# Patient Record
Sex: Male | Born: 1960 | Race: White | Hispanic: No | Marital: Single | Smoking: Never smoker
Health system: Southern US, Community
[De-identification: ages and names within clinical notes are randomized; demographics above are authoritative.]

## PROBLEM LIST (undated history)

## (undated) DIAGNOSIS — E7800 Pure hypercholesterolemia, unspecified: Secondary | ICD-10-CM

## (undated) DIAGNOSIS — H269 Unspecified cataract: Secondary | ICD-10-CM

## (undated) DIAGNOSIS — B029 Zoster without complications: Secondary | ICD-10-CM

## (undated) DIAGNOSIS — R569 Unspecified convulsions: Secondary | ICD-10-CM

## (undated) DIAGNOSIS — I10 Essential (primary) hypertension: Secondary | ICD-10-CM

## (undated) DIAGNOSIS — F99 Mental disorder, not otherwise specified: Secondary | ICD-10-CM

## (undated) HISTORY — DX: Essential (primary) hypertension: I10

## (undated) HISTORY — DX: Zoster without complications: B02.9

## (undated) HISTORY — DX: Pure hypercholesterolemia, unspecified: E78.00

## (undated) HISTORY — DX: Unspecified cataract: H26.9

---

## 2004-10-31 ENCOUNTER — Emergency Department (HOSPITAL_COMMUNITY): Admission: EM | Admit: 2004-10-31 | Discharge: 2004-10-31 | Payer: Self-pay | Admitting: Emergency Medicine

## 2004-11-01 ENCOUNTER — Emergency Department (HOSPITAL_COMMUNITY): Admission: EM | Admit: 2004-11-01 | Discharge: 2004-11-01 | Payer: Self-pay | Admitting: Emergency Medicine

## 2004-11-05 ENCOUNTER — Inpatient Hospital Stay (HOSPITAL_COMMUNITY): Admission: EM | Admit: 2004-11-05 | Discharge: 2004-11-09 | Payer: Self-pay | Admitting: Emergency Medicine

## 2009-09-22 DIAGNOSIS — K635 Polyp of colon: Secondary | ICD-10-CM

## 2009-09-22 HISTORY — DX: Polyp of colon: K63.5

## 2013-05-25 DIAGNOSIS — L219 Seborrheic dermatitis, unspecified: Secondary | ICD-10-CM

## 2013-05-25 HISTORY — DX: Seborrheic dermatitis, unspecified: L21.9

## 2016-10-05 ENCOUNTER — Encounter (HOSPITAL_COMMUNITY): Payer: Self-pay | Admitting: Emergency Medicine

## 2016-10-05 ENCOUNTER — Ambulatory Visit (HOSPITAL_COMMUNITY)
Admission: EM | Admit: 2016-10-05 | Discharge: 2016-10-05 | Disposition: A | Discharge status: Home / Self Care | Payer: Medicare Other | Attending: Internal Medicine | Admitting: Internal Medicine

## 2016-10-05 DIAGNOSIS — J209 Acute bronchitis, unspecified: Secondary | ICD-10-CM

## 2016-10-05 DIAGNOSIS — R0981 Nasal congestion: Secondary | ICD-10-CM | POA: Diagnosis not present

## 2016-10-05 DIAGNOSIS — R05 Cough: Secondary | ICD-10-CM

## 2016-10-05 HISTORY — DX: Unspecified convulsions: R56.9

## 2016-10-05 MED ORDER — IPRATROPIUM-ALBUTEROL 0.5-2.5 (3) MG/3ML IN SOLN
3.0000 mL | Freq: Once | RESPIRATORY_TRACT | Status: AC
  Administered 2016-10-05: 3 mL via RESPIRATORY_TRACT

## 2016-10-05 MED ORDER — ONDANSETRON HCL 4 MG PO TABS: 8.0000 mg | ORAL_TABLET | ORAL | 0 refills | Status: DC | PRN

## 2016-10-05 MED ORDER — PREDNISONE 50 MG PO TABS: 50.0000 mg | ORAL_TABLET | Freq: Every day | ORAL | 0 refills | Status: DC

## 2016-10-05 MED ORDER — IPRATROPIUM-ALBUTEROL 0.5-2.5 (3) MG/3ML IN SOLN
RESPIRATORY_TRACT | Status: AC
  Filled 2016-10-05: qty 3

## 2016-10-05 MED ORDER — ALBUTEROL SULFATE HFA 108 (90 BASE) MCG/ACT IN AERS: 2.0000 | INHALATION_SPRAY | RESPIRATORY_TRACT | 0 refills | Status: DC | PRN

## 2016-10-05 MED ORDER — TRIAMCINOLONE ACETONIDE 55 MCG/ACT NA AERO: 2.0000 | INHALATION_SPRAY | Freq: Every day | NASAL | 0 refills | Status: DC

## 2016-10-05 NOTE — ED Triage Notes (Signed)
Here w/group home provider ([facility]ath[facility] Col[facility])  Pt c/o cold sx onset 1 week associated w/dry cough, dizziness, nauseas, abd pain  Denies fevers  Had Pepto bismol w/no relief.   A&O x4... NAD... Ambulatory

## 2016-10-05 NOTE — Discharge Instructions (Addendum)
Sinus congestion on exam can contribute to dizziness.  Push fluids, rest.  Anticipate gradual improvement in well being over the next several days.  Cough/congestion may take a couple weeks to subside.  Recheck for new fever >100.5, increasing phlegm production/nasal discharge, or if not starting to improve in a few days.  Prescriptions for prednisone (for congestion), nasal steroid spray (also for congestion), and an albuterol inhaler (for cough) were sent to the pharmacy.  Also sent a prescription for ondansetron for nausea/vomiting, to use if needed.

## 2016-10-05 NOTE — ED Provider Notes (Signed)
[facility]    CSN: [id number] Arrival date & time: 10/05/16  1209     History   Chief Complaint Chief Complaint  Patient presents with  . URI    HPI Gary Neal is a 56 y.o. male. Presents today with 1wk history coughing spells, hard coughing.  No fever.  Unexpected emesis during dinner last evening, unclear if post-tussive.  No further emesis.  Ate uneventfully this am.  Denies abd pain, denies diarrhea.  Also feels a little dizzy.  Has not fallen.  Here with staff member from group home where he resides.  Walked in independently.      HPI  Past Medical History:  Diagnosis Date  . Seizures (HCC)     History reviewed. No pertinent surgical history.     Home Medications    Prior to Admission medications   Medication Sig Start Date End Date Taking? Authorizing Provider  allopurinol (ZYLOPRIM) 100 MG tablet Take 100 mg by mouth daily.   Yes [provider]  cetirizine (ZYRTEC) 10 MG tablet Take 10 mg by mouth daily.   Yes [provider]  clotrimazole-betamethasone (LOTRISONE) cream Apply 1 application topically 2 (two) times daily.   Yes [provider]  colchicine 0.6 MG tablet Take 0.6 mg by mouth daily.   Yes [provider]  Docusate Sodium (STOOL SOFTENER) 100 MG capsule Take 100 mg by mouth 2 (two) times daily.   Yes [provider]  Ergocalciferol (VITAMIN D2 PO) Take by mouth.   Yes [provider]  lisinopril (PRINIVIL,ZESTRIL) 10 MG tablet Take 10 mg by mouth daily.   Yes [provider]  metoprolol succinate (TOPROL-XL) 25 MG 24 hr tablet Take 25 mg by mouth daily.   Yes [provider]  nystatin cream (MYCOSTATIN) Apply 1 application topically 2 (two) times daily.   Yes [provider]  primidone (MYSOLINE) 250 MG tablet Take 250 mg by mouth 4 (four) times daily.   Yes [provider]  simvastatin (ZOCOR) 20 MG tablet Take 20 mg by mouth daily.   Yes  [provider]  triamterene-hydrochlorothiazide (DYAZIDE) 37.5-25 MG capsule Take 1 capsule by mouth daily.   Yes [provider]  albuterol (PROVENTIL HFA;VENTOLIN HFA) 108 (90 Base) MCG/ACT inhaler Inhale 2 puffs into the lungs every 4 (four) hours as needed for wheezing or shortness of breath. 10/05/16   Eustace [name], MD  ondansetron (ZOFRAN) 4 MG tablet Take 2 tablets (8 mg total) by mouth every 4 (four) hours as needed for nausea or vomiting. 10/05/16   Eustace [name], MD  predniSONE (DELTASONE) 50 MG tablet Take 1 tablet (50 mg total) by mouth daily. 10/05/16   Eustace [name], MD  triamcinolone (NASACORT) 55 MCG/ACT AERO nasal inhaler Place 2 sprays into the nose daily. 10/05/16   Eustace [name], MD    Family History History reviewed. No pertinent family history.  Social History Social History  Substance Use Topics  . Smoking status: Never Smoker  . Smokeless tobacco: Never Used  . Alcohol use Not on file     Allergies   Patient has no known allergies.   Review of Systems Review of Systems  All other systems reviewed and are negative.    Physical Exam Triage Vital Signs ED Triage Vitals  Enc Vitals Group     BP 10/05/16 1322 (!) 166/74     Pulse Rate 10/05/16 1322 99     Resp 10/05/16 1322 20  Temp 10/05/16 1322 98.1 F (36.7 C)     Temp Source 10/05/16 1322 Oral     SpO2 10/05/16 1322 96 %     Weight --      Height --      Pain Score 10/05/16 1323 10     Pain Loc --    Updated Vital Signs BP (!) 166/74 (BP Location: Right Arm)   Pulse 88   Temp 98.1 F (36.7 C) (Oral)   Resp 20   SpO2 98%   Physical Exam  Constitutional: He is oriented to person, place, and time. No distress.  Alert, nicely groomed  HENT:  Head: Atraumatic.  bilat TMs obscured by wax; no ear complaints Mod severe nasal congestion bilat Throat slightly injected with post nasal drainage evident  Eyes:  Conjugate gaze, no eye redness/drainage  Neck: Neck  supple.  Cardiovascular: Normal rate and regular rhythm.   Pulmonary/Chest: No respiratory distress. He has no wheezes. He has no rales.  Coarse but symmetric breath sounds throughout  Abdominal: He exhibits no distension.  Musculoskeletal: Normal range of motion.  Neurological: He is alert and oriented to person, place, and time.  Walked into urgent care independently, climbed on/off exam table  Skin: Skin is warm and dry.  No cyanosis  Nursing note and vitals reviewed.    UC Treatments / Results   Procedures Procedures (including critical care time)  Medications Ordered in UC Medications  ipratropium-albuterol (DUONEB) 0.5-2.5 (3) MG/3ML nebulizer solution 3 mL (3 mLs Nebulization Given 10/05/16 1345)   Reports that breathing treatment was helpful.   Final Clinical Impressions(s) / UC Diagnoses   Final diagnoses:  Acute bronchitis, unspecified organism  Sinus congestion   Sinus congestion on exam can contribute to dizziness.  Push fluids, rest.  Anticipate gradual improvement in well being over the next several days.  Cough/congestion may take a couple weeks to subside.  Recheck for new fever >100.5, increasing phlegm production/nasal discharge, or if not starting to improve in a few days.  Prescriptions for prednisone (for congestion), nasal steroid spray (also for congestion), and an albuterol inhaler (for cough) were sent to the pharmacy.  Also sent a prescription for ondansetron for nausea/vomiting, to use if needed.  New Prescriptions Discharge Medication List as of 10/05/2016  2:26 PM    START taking these medications   Details  albuterol (PROVENTIL HFA;VENTOLIN HFA) 108 (90 Base) MCG/ACT inhaler Inhale 2 puffs into the lungs every 4 (four) hours as needed for wheezing or shortness of breath., Starting Sat 10/05/2016, Normal    ondansetron (ZOFRAN) 4 MG tablet Take 2 tablets (8 mg total) by mouth every 4 (four) hours as needed for nausea or vomiting., Starting Sat  10/05/2016, Normal    predniSONE (DELTASONE) 50 MG tablet Take 1 tablet (50 mg total) by mouth daily., Starting Sat 10/05/2016, Normal    triamcinolone (NASACORT) 55 MCG/ACT AERO nasal inhaler Place 2 sprays into the nose daily., Starting Sat 10/05/2016, Normal         Eustace MooreMurray, Vinnie Gombert W, MD 10/05/16 2114

## 2018-10-19 ENCOUNTER — Inpatient Hospital Stay (HOSPITAL_COMMUNITY): Payer: Medicare HMO

## 2018-10-19 ENCOUNTER — Inpatient Hospital Stay (HOSPITAL_COMMUNITY)
Admission: EM | Admit: 2018-10-19 | Discharge: 2018-10-21 | DRG: 854 | Disposition: A | Discharge status: Home / Self Care | Payer: Medicare HMO | Attending: Internal Medicine | Admitting: Internal Medicine

## 2018-10-19 ENCOUNTER — Other Ambulatory Visit: Payer: Self-pay

## 2018-10-19 ENCOUNTER — Emergency Department (HOSPITAL_COMMUNITY): Payer: Medicare HMO

## 2018-10-19 ENCOUNTER — Encounter (HOSPITAL_COMMUNITY): Payer: Self-pay | Admitting: Emergency Medicine

## 2018-10-19 DIAGNOSIS — G40909 Epilepsy, unspecified, not intractable, without status epilepticus: Secondary | ICD-10-CM | POA: Diagnosis present

## 2018-10-19 DIAGNOSIS — E871 Hypo-osmolality and hyponatremia: Secondary | ICD-10-CM | POA: Diagnosis present

## 2018-10-19 DIAGNOSIS — R1011 Right upper quadrant pain: Secondary | ICD-10-CM | POA: Diagnosis present

## 2018-10-19 DIAGNOSIS — Z79899 Other long term (current) drug therapy: Secondary | ICD-10-CM

## 2018-10-19 DIAGNOSIS — J90 Pleural effusion, not elsewhere classified: Secondary | ICD-10-CM | POA: Diagnosis present

## 2018-10-19 DIAGNOSIS — I10 Essential (primary) hypertension: Secondary | ICD-10-CM | POA: Diagnosis present

## 2018-10-19 DIAGNOSIS — R6250 Unspecified lack of expected normal physiological development in childhood: Secondary | ICD-10-CM | POA: Diagnosis present

## 2018-10-19 DIAGNOSIS — E785 Hyperlipidemia, unspecified: Secondary | ICD-10-CM | POA: Diagnosis present

## 2018-10-19 DIAGNOSIS — M109 Gout, unspecified: Secondary | ICD-10-CM | POA: Diagnosis present

## 2018-10-19 DIAGNOSIS — K82A1 Gangrene of gallbladder in cholecystitis: Secondary | ICD-10-CM | POA: Diagnosis present

## 2018-10-19 DIAGNOSIS — R0989 Other specified symptoms and signs involving the circulatory and respiratory systems: Secondary | ICD-10-CM

## 2018-10-19 DIAGNOSIS — I4519 Other right bundle-branch block: Secondary | ICD-10-CM | POA: Diagnosis present

## 2018-10-19 DIAGNOSIS — R0682 Tachypnea, not elsewhere classified: Secondary | ICD-10-CM

## 2018-10-19 DIAGNOSIS — A419 Sepsis, unspecified organism: Principal | ICD-10-CM | POA: Diagnosis present

## 2018-10-19 DIAGNOSIS — F79 Unspecified intellectual disabilities: Secondary | ICD-10-CM

## 2018-10-19 DIAGNOSIS — K819 Cholecystitis, unspecified: Secondary | ICD-10-CM

## 2018-10-19 DIAGNOSIS — E876 Hypokalemia: Secondary | ICD-10-CM | POA: Diagnosis present

## 2018-10-19 DIAGNOSIS — E669 Obesity, unspecified: Secondary | ICD-10-CM | POA: Diagnosis present

## 2018-10-19 DIAGNOSIS — E860 Dehydration: Secondary | ICD-10-CM | POA: Diagnosis present

## 2018-10-19 DIAGNOSIS — N179 Acute kidney failure, unspecified: Secondary | ICD-10-CM | POA: Diagnosis present

## 2018-10-19 DIAGNOSIS — K810 Acute cholecystitis: Secondary | ICD-10-CM | POA: Diagnosis present

## 2018-10-19 DIAGNOSIS — Z8619 Personal history of other infectious and parasitic diseases: Secondary | ICD-10-CM | POA: Diagnosis not present

## 2018-10-19 DIAGNOSIS — Z20828 Contact with and (suspected) exposure to other viral communicable diseases: Secondary | ICD-10-CM | POA: Diagnosis present

## 2018-10-19 HISTORY — DX: Acute cholecystitis: K81.0

## 2018-10-19 LAB — URINALYSIS, ROUTINE W REFLEX MICROSCOPIC
Bilirubin Urine: NEGATIVE
Glucose, UA: NEGATIVE mg/dL
Ketones, ur: NEGATIVE mg/dL
Leukocytes,Ua: NEGATIVE
Nitrite: NEGATIVE
Protein, ur: NEGATIVE mg/dL
Specific Gravity, Urine: 1.008 (ref 1.005–1.030)
pH: 5 (ref 5.0–8.0)

## 2018-10-19 LAB — LACTIC ACID, PLASMA
Lactic Acid, Venous: 1.7 mmol/L (ref 0.5–1.9)
Lactic Acid, Venous: 1.8 mmol/L (ref 0.5–1.9)

## 2018-10-19 LAB — BASIC METABOLIC PANEL
Anion gap: 15 (ref 5–15)
Anion gap: 12 (ref 5–15)
BUN: 29 mg/dL — ABNORMAL HIGH (ref 6–20)
BUN: 29 mg/dL — ABNORMAL HIGH (ref 6–20)
CO2: 20 mmol/L — ABNORMAL LOW (ref 22–32)
CO2: 19 mmol/L — ABNORMAL LOW (ref 22–32)
Calcium: 7.7 mg/dL — ABNORMAL LOW (ref 8.9–10.3)
Calcium: 7.5 mg/dL — ABNORMAL LOW (ref 8.9–10.3)
Chloride: 93 mmol/L — ABNORMAL LOW (ref 98–111)
Chloride: 95 mmol/L — ABNORMAL LOW (ref 98–111)
Creatinine, Ser: 1.39 mg/dL — ABNORMAL HIGH (ref 0.61–1.24)
Creatinine, Ser: 1.28 mg/dL — ABNORMAL HIGH (ref 0.61–1.24)
GFR calc Af Amer: >60 mL/min (ref >60)
GFR calc Af Amer: >60 mL/min (ref >60)
GFR calc non Af Amer: 55 mL/min — ABNORMAL LOW (ref >60)
GFR calc non Af Amer: >60 mL/min (ref >60)
Glucose, Bld: 105 mg/dL — ABNORMAL HIGH (ref 70–99)
Glucose, Bld: 118 mg/dL — ABNORMAL HIGH (ref 70–99)
Potassium: 3.5 mmol/L (ref 3.5–5.1)
Potassium: 3.5 mmol/L (ref 3.5–5.1)
Sodium: 128 mmol/L — ABNORMAL LOW (ref 135–145)
Sodium: 126 mmol/L — ABNORMAL LOW (ref 135–145)

## 2018-10-19 LAB — CBC WITH DIFFERENTIAL/PLATELET
Abs Immature Granulocytes: 0.21 10*3/uL — ABNORMAL HIGH (ref 0.00–0.07)
Basophils Absolute: 0 10*3/uL (ref 0.0–0.1)
Basophils Relative: 0 %
Eosinophils Absolute: 0.1 10*3/uL (ref 0.0–0.5)
Eosinophils Relative: 0 %
HCT: 44.9 % (ref 39.0–52.0)
Hemoglobin: 15.3 g/dL (ref 13.0–17.0)
Immature Granulocytes: 1 %
Lymphocytes Relative: 5 %
Lymphs Abs: 1.1 10*3/uL (ref 0.7–4.0)
MCH: 32.1 pg (ref 26.0–34.0)
MCHC: 34.1 g/dL (ref 30.0–36.0)
MCV: 94.1 fL (ref 80.0–100.0)
Monocytes Absolute: 2.5 10*3/uL — ABNORMAL HIGH (ref 0.1–1.0)
Monocytes Relative: 11 %
Neutro Abs: 18.1 10*3/uL — ABNORMAL HIGH (ref 1.7–7.7)
Neutrophils Relative %: 83 %
Platelets: 179 10*3/uL (ref 150–400)
RBC: 4.77 MIL/uL (ref 4.22–5.81)
RDW: 13.6 % (ref 11.5–15.5)
WBC: 22 10*3/uL — ABNORMAL HIGH (ref 4.0–10.5)
nRBC: 0 % (ref 0.0–0.2)

## 2018-10-19 LAB — COMPREHENSIVE METABOLIC PANEL
ALT: 44 U/L (ref 0–44)
AST: 68 U/L — ABNORMAL HIGH (ref 15–41)
Albumin: 3.6 g/dL (ref 3.5–5.0)
Alkaline Phosphatase: 74 U/L (ref 38–126)
Anion gap: 15 (ref 5–15)
BUN: 39 mg/dL — ABNORMAL HIGH (ref 6–20)
CO2: 22 mmol/L (ref 22–32)
Calcium: 7.5 mg/dL — ABNORMAL LOW (ref 8.9–10.3)
Chloride: 85 mmol/L — ABNORMAL LOW (ref 98–111)
Creatinine, Ser: 1.69 mg/dL — ABNORMAL HIGH (ref 0.61–1.24)
GFR calc Af Amer: 50 mL/min — ABNORMAL LOW (ref >60)
GFR calc non Af Amer: 43 mL/min — ABNORMAL LOW (ref >60)
Glucose, Bld: 119 mg/dL — ABNORMAL HIGH (ref 70–99)
Potassium: 2.9 mmol/L — ABNORMAL LOW (ref 3.5–5.1)
Sodium: 122 mmol/L — ABNORMAL LOW (ref 135–145)
Total Bilirubin: 0.9 mg/dL (ref 0.3–1.2)
Total Protein: 7.4 g/dL (ref 6.5–8.1)

## 2018-10-19 LAB — MRSA PCR SCREENING: MRSA by PCR: NEGATIVE

## 2018-10-19 LAB — SURGICAL PCR SCREEN
MRSA, PCR: NEGATIVE
Staphylococcus aureus: NEGATIVE

## 2018-10-19 LAB — SARS CORONAVIRUS 2 BY RT PCR (HOSPITAL ORDER, PERFORMED IN ~~LOC~~ HOSPITAL LAB): SARS Coronavirus 2: NEGATIVE

## 2018-10-19 LAB — LIPASE, BLOOD: Lipase: 24 U/L (ref 11–51)

## 2018-10-19 LAB — MAGNESIUM: Magnesium: 2.1 mg/dL (ref 1.7–2.4)

## 2018-10-19 MED ORDER — MUPIROCIN 2 % EX OINT
1.0000 "application " | TOPICAL_OINTMENT | Freq: Two times a day (BID) | CUTANEOUS | Status: DC
  Administered 2018-10-19: 1 via NASAL
  Filled 2018-10-19: qty 22

## 2018-10-19 MED ORDER — DONEPEZIL HCL 5 MG PO TABS
5.0000 mg | ORAL_TABLET | Freq: Every day | ORAL | Status: DC
  Administered 2018-10-19 – 2018-10-20 (×2): 5 mg via ORAL
  Filled 2018-10-19 (×2): qty 1

## 2018-10-19 MED ORDER — ENOXAPARIN SODIUM 40 MG/0.4ML ~~LOC~~ SOLN
40.0000 mg | SUBCUTANEOUS | Status: DC
  Administered 2018-10-19 – 2018-10-20 (×2): 40 mg via SUBCUTANEOUS
  Filled 2018-10-19 (×2): qty 0.4

## 2018-10-19 MED ORDER — ONDANSETRON HCL 4 MG PO TABS: 4.0000 mg | ORAL_TABLET | Freq: Four times a day (QID) | ORAL | Status: DC | PRN

## 2018-10-19 MED ORDER — SIMVASTATIN 20 MG PO TABS
20.0000 mg | ORAL_TABLET | Freq: Every day | ORAL | Status: DC
  Administered 2018-10-19 – 2018-10-21 (×3): 20 mg via ORAL
  Filled 2018-10-19 (×3): qty 1

## 2018-10-19 MED ORDER — ACETAMINOPHEN 325 MG PO TABS
650.0000 mg | ORAL_TABLET | Freq: Four times a day (QID) | ORAL | Status: DC | PRN
  Administered 2018-10-19 – 2018-10-21 (×4): 650 mg via ORAL
  Filled 2018-10-19 (×4): qty 2

## 2018-10-19 MED ORDER — IOHEXOL 300 MG/ML  SOLN
100.0000 mL | Freq: Once | INTRAMUSCULAR | Status: AC | PRN
  Administered 2018-10-19: 100 mL via INTRAVENOUS

## 2018-10-19 MED ORDER — ONDANSETRON HCL 4 MG/2ML IJ SOLN: 4.0000 mg | Freq: Four times a day (QID) | INTRAMUSCULAR | Status: DC | PRN

## 2018-10-19 MED ORDER — HYDRALAZINE HCL 20 MG/ML IJ SOLN: 5.0000 mg | Freq: Four times a day (QID) | INTRAMUSCULAR | Status: DC | PRN

## 2018-10-19 MED ORDER — POTASSIUM CHLORIDE 10 MEQ/100ML IV SOLN
10.0000 meq | INTRAVENOUS | Status: AC
  Administered 2018-10-19 (×2): 10 meq via INTRAVENOUS
  Filled 2018-10-19 (×2): qty 100

## 2018-10-19 MED ORDER — POTASSIUM CHLORIDE IN NACL 40-0.9 MEQ/L-% IV SOLN
INTRAVENOUS | Status: DC
  Filled 2018-10-19: qty 1000

## 2018-10-19 MED ORDER — SODIUM CHLORIDE 0.9 % IV BOLUS
1000.0000 mL | Freq: Once | INTRAVENOUS | Status: AC
Start: 2018-10-19 — End: 2018-10-19
  Administered 2018-10-19: 1000 mL via INTRAVENOUS

## 2018-10-19 MED ORDER — PRIMIDONE 250 MG PO TABS
250.0000 mg | ORAL_TABLET | Freq: Three times a day (TID) | ORAL | Status: DC
  Administered 2018-10-19 – 2018-10-21 (×4): 250 mg via ORAL
  Filled 2018-10-19 (×7): qty 1

## 2018-10-19 MED ORDER — LORATADINE 10 MG PO TABS
10.0000 mg | ORAL_TABLET | Freq: Every day | ORAL | Status: DC
  Administered 2018-10-20 – 2018-10-21 (×2): 10 mg via ORAL
  Filled 2018-10-19 (×2): qty 1

## 2018-10-19 MED ORDER — METOPROLOL SUCCINATE ER 25 MG PO TB24
25.0000 mg | ORAL_TABLET | Freq: Two times a day (BID) | ORAL | Status: DC
  Administered 2018-10-19 – 2018-10-21 (×4): 25 mg via ORAL
  Filled 2018-10-19 (×4): qty 1

## 2018-10-19 MED ORDER — PIPERACILLIN-TAZOBACTAM 3.375 G IVPB
3.3750 g | Freq: Three times a day (TID) | INTRAVENOUS | Status: DC
  Administered 2018-10-19 – 2018-10-20 (×2): 3.375 g via INTRAVENOUS
  Filled 2018-10-19 (×2): qty 50

## 2018-10-19 MED ORDER — PIPERACILLIN-TAZOBACTAM 3.375 G IVPB 30 MIN
3.3750 g | Freq: Once | INTRAVENOUS | Status: AC
  Administered 2018-10-19: 3.375 g via INTRAVENOUS
  Filled 2018-10-19: qty 50

## 2018-10-19 MED ORDER — SODIUM CHLORIDE (PF) 0.9 % IJ SOLN
INTRAMUSCULAR | Status: AC
  Filled 2018-10-19: qty 50

## 2018-10-19 NOTE — Progress Notes (Signed)
Pharmacy Antibiotic Note  Gary Neal is a 58 y.o. male admitted on 10/19/2018 with intra-abdominal infection.  Pharmacy has been consulted for Zosyn dosing.  Scr elevated (baseline unknown).  CrCl >42ml/min.   Plan: Zosyn 3.375g IV q8h (4 hour infusion).  No dose adjustments anticipated.  Pharmacy will sign off & monitor peripherally via electronic surveillance software.  Please re-consult if needed.    Height: 5\' 11"  (180.3 cm) Weight: 200 lb (90.7 kg) IBW/kg (Calculated) : 75.3  Temp (24hrs), Avg:98.4 F (36.9 C), Min:98.4 F (36.9 C), Max:98.4 F (36.9 C)  Recent Labs  Lab 10/19/18 0928  WBC 22.0*  CREATININE 1.69*  LATICACIDVEN 1.7    Estimated Creatinine Clearance: 54.3 mL/min (A) (by C-G formula based on SCr of 1.69 mg/dL (H)).    No Known Allergies  Antimicrobials this admission: 10/19/18  Zosyn >>   Dose adjustments this admission:  Microbiology results:  Thank you for allowing pharmacy to be a part of this patient's care.  Elson Clan 10/19/2018 2:03 PM

## 2018-10-19 NOTE — Plan of Care (Signed)
  Problem: Clinical Measurements: Goal: Ability to maintain clinical measurements within normal limits will improve Outcome: Progressing Goal: Will remain free from infection Outcome: Progressing Goal: Diagnostic test results will improve Outcome: Progressing Goal: Respiratory complications will improve Outcome: Progressing Goal: Cardiovascular complication will be avoided Outcome: Progressing   Problem: Nutrition: Goal: Adequate nutrition will be maintained Outcome: Progressing   Problem: Coping: Goal: Level of anxiety will decrease Outcome: Progressing   Problem: Pain Managment: Goal: General experience of comfort will improve Outcome: Progressing   Problem: Skin Integrity: Goal: Risk for impaired skin integrity will decrease Outcome: Progressing   

## 2018-10-19 NOTE — ED Notes (Signed)
Clint Guy requesting to call [phone] regarding plan of care decisions.

## 2018-10-19 NOTE — ED Triage Notes (Signed)
Patient arrived by EMS from a group home. Pt c/o RT upper ABD pain and headache per EMS. Staff at group home stated patient became bowel incontinent per EMS. Staff stated patient has had diarrhea x 3 days. Pt c/o ABD pain. Staff stated patient has decreased mental status per EMS.    Pt has developmental delays. Pt alert and oriented x 2.   Patient normally is more talkative and able to communicate per EMS.   EMS VS BP 132/77, HR 96, CBG 132, T 99.7, SpO2 96%.

## 2018-10-19 NOTE — ED Notes (Signed)
ED TO INPATIENT HANDOFF REPORT  ED Nurse Name and Phone #: Joice Lofts, RN [phone]  S Name/Age/Gender Gary Neal 58 y.o. male Room/Bed: WA17/WA17  Code Status   Code Status: Not on file  Home/SNF/Other [facility]  Patient oriented to: self, sittuation Is this baseline? Yes   Triage Complete: Triage complete  Chief Complaint Abd pain; diarrhea  Triage Note Patient arrived by EMS from a group home. Pt c/o RT upper ABD pain and headache per EMS. Staff at group home stated patient became bowel incontinent per EMS. Staff stated patient has had diarrhea x 3 days. Pt c/o ABD pain. Staff stated patient has decreased mental status per EMS.    Pt has developmental delays. Pt alert and oriented x 2.   Patient normally is more talkative and able to communicate per EMS.   EMS VS BP 132/77, HR 96, CBG 132, T 99.7, SpO2 96%.   Allergies No Known Allergies  Level of Care/Admitting Diagnosis ED Disposition    ED Disposition Condition Comment   Admit  Hospital Area: [facility] [[id number]]  Level of Care: Telemetry [5]  Admit to tele based on following criteria: Other see comments  Comments: Electrolyte abnormalities  Covid Evaluation: Screening Protocol (No Symptoms)  Diagnosis: Acute cholecystitis [575.0.ICD-9-CM]  Admitting Physician: Narda Bonds [[id number]]  Attending Physician: Narda Bonds [phone]  Estimated length of stay: past midnight tomorrow  Certification:: I certify this patient will need inpatient services for at least 2 midnights  PT Class (Do Not Modify): Inpatient [101]  PT Acc Code (Do Not Modify): Private [1]       B Medical/Surgery History Past Medical History:  Diagnosis Date  . Seizures (HCC)    History reviewed. No pertinent surgical history.   A IV Location/Drains/Wounds Patient Lines/Drains/Airways Status   Active Line/Drains/Airways    Name:   Placement date:   Placement time:   Site:   Days:   Peripheral IV 10/19/18  Right Antecubital   10/19/18    0934    Antecubital   less than 1   Peripheral IV 10/19/18 Right Forearm   10/19/18    1540    Forearm   less than 1   Urethral Catheter Alyson Reedy, NT Straight-tip 16 Fr.   10/19/18    1603    Straight-tip   less than 1          Intake/Output Last 24 hours  Intake/Output Summary (Last 24 hours) at 10/19/2018 1640 Last data filed at 10/19/2018 1627 Gross per 24 hour  Intake 1147.54 ml  Output 325 ml  Net 822.54 ml    Labs/Imaging Results for orders placed or performed during the hospital encounter of 10/19/18 (from the past 48 hour(s))  Urinalysis, Routine w reflex microscopic     Status: Abnormal   Collection Time: 10/19/18  8:53 AM  Result Value Ref Range   Color, Urine YELLOW YELLOW   APPearance CLEAR CLEAR   Specific Gravity, Urine 1.008 1.005 - 1.030   pH 5.0 5.0 - 8.0   Glucose, UA NEGATIVE NEGATIVE mg/dL   Hgb urine dipstick SMALL (A) NEGATIVE   Bilirubin Urine NEGATIVE NEGATIVE   Ketones, ur NEGATIVE NEGATIVE mg/dL   Protein, ur NEGATIVE NEGATIVE mg/dL   Nitrite NEGATIVE NEGATIVE   Leukocytes,Ua NEGATIVE NEGATIVE   WBC, UA 0-5 0 - 5 WBC/hpf   Bacteria, UA RARE (A) NONE SEEN   Squamous Epithelial / LPF 0-5 0 - 5   Mucus PRESENT  Comment: Performed at [facility], [address] [place], [place] [address]  Comprehensive metabolic panel     Status: Abnormal   Collection Time: 10/19/18  9:28 AM  Result Value Ref Range   Sodium 122 (L) 135 - 145 mmol/L   Potassium 2.9 (L) 3.5 - 5.1 mmol/L   Chloride 85 (L) 98 - 111 mmol/L   CO2 22 22 - 32 mmol/L   Glucose, Bld 119 (H) 70 - 99 mg/dL   BUN 39 (H) 6 - 20 mg/dL   Creatinine, Ser 6.04 (H) 0.61 - 1.24 mg/dL   Calcium 7.5 (L) 8.9 - 10.3 mg/dL   Total Protein 7.4 6.5 - 8.1 g/dL   Albumin 3.6 3.5 - 5.0 g/dL   AST 68 (H) 15 - 41 U/L   ALT 44 0 - 44 U/L   Alkaline Phosphatase 74 38 - 126 U/L   Total Bilirubin 0.9 0.3 - 1.2 mg/dL   GFR calc non Af Amer 43 (L)  >60 mL/min   GFR calc Af Amer 50 (L) >60 mL/min   Anion gap 15 5 - 15    Comment: Performed at [facility], [address], [place], [place] [address]  CBC with Differential     Status: Abnormal   Collection Time: 10/19/18  9:28 AM  Result Value Ref Range   WBC 22.0 (H) 4.0 - 10.5 K/uL   RBC 4.77 4.22 - 5.81 MIL/uL   Hemoglobin 15.3 13.0 - 17.0 g/dL   HCT 11.9 14.7 - 82.9 %   MCV 94.1 80.0 - 100.0 fL   MCH 32.1 26.0 - 34.0 pg   MCHC 34.1 30.0 - 36.0 g/dL   RDW 56.2 13.0 - 86.5 %   Platelets 179 150 - 400 K/uL   nRBC 0.0 0.0 - 0.2 %   Neutrophils Relative % 83 %   Neutro Abs 18.1 (H) 1.7 - 7.7 K/uL   Lymphocytes Relative 5 %   Lymphs Abs 1.1 0.7 - 4.0 K/uL   Monocytes Relative 11 %   Monocytes Absolute 2.5 (H) 0.1 - 1.0 K/uL   Eosinophils Relative 0 %   Eosinophils Absolute 0.1 0.0 - 0.5 K/uL   Basophils Relative 0 %   Basophils Absolute 0.0 0.0 - 0.1 K/uL   Immature Granulocytes 1 %   Abs Immature Granulocytes 0.21 (H) 0.00 - 0.07 K/uL    Comment: Performed at [facility], [address], [place], [place] [address]  Lactic acid, plasma     Status: None   Collection Time: 10/19/18  9:28 AM  Result Value Ref Range   Lactic Acid, Venous 1.7 0.5 - 1.9 mmol/L    Comment: Performed at [facility], [address], [place], [place] [address]  Lipase, blood     Status: None   Collection Time: 10/19/18  9:28 AM  Result Value Ref Range   Lipase 24 11 - 51 U/L    Comment: Performed at [facility], [address] [place], [place] [address]  Magnesium     Status: None   Collection Time: 10/19/18  9:28 AM  Result Value Ref Range   Magnesium 2.1 1.7 - 2.4 mg/dL    Comment: Performed at [facility], [address], [place], [place] [address]   Dg Chest 2 View  Result Date: 10/19/2018 CLINICAL DATA:  Intraabdominal infection. EXAM: CHEST - 2 VIEW COMPARISON:  11/05/2004 FINDINGS: Artifact  overlies the chest. There is a  poor inspiration. There is a pleural effusion on the right. There is dependent pulmonary atelectasis and or infiltrate at the right lung base. No significant bone finding. IMPRESSION: Right effusion with right base atelectasis and or pneumonia Electronically Signed   By: Paulina Fusi M.D.   On: 10/19/2018 16:28   Ct Abdomen Pelvis W Contrast  Result Date: 10/19/2018 CLINICAL DATA:  Acute abdominal pain.  Diarrhea. EXAM: CT ABDOMEN AND PELVIS WITH CONTRAST TECHNIQUE: Multidetector CT imaging of the abdomen and pelvis was performed using the standard protocol following bolus administration of intravenous contrast. CONTRAST:  OMNIPAQUE IOHEXOL 300 MG/ML  SOLN COMPARISON:  Radiographs dated 10/31/2004 FINDINGS: Lower chest: Small right pleural effusion. Focal atelectasis at the right lung base with a single 7 mm lymph node at the inferior aspect of the hilum. Heart appears normal. Hepatobiliary: The gallbladder is distended with edema in the gallbladder wall. No defined stones. No biliary ductal dilatation. Liver parenchyma is normal. Small amount of free fluid around the gallbladder in the right pericolic gutter and surrounding the second portion of the duodenum. Pancreas: Unremarkable. No pancreatic ductal dilatation or surrounding inflammatory changes. Spleen: Normal in size without focal abnormality. Adrenals/Urinary Tract: Adrenal glands and kidneys are normal. No hydronephrosis. Small diverticulum in the right side of the bladder. The bladder is distended almost to the level of the umbilicus. Stomach/Bowel: There is slight soft tissue stranding around the second portion of the duodenum with slight prominence of the mucosa of the second portion of the duodenum. No discrete ulcer. The appendix is not discretely identified. The cecum lies in right mid abdomen Vascular/Lymphatic: No significant vascular findings are present. No enlarged abdominal or pelvic lymph nodes.  Reproductive: Prostate is unremarkable. Other: No free air. 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 There is a small sclerotic lesions in the proximal sacrum which are felt to represent bone islands. At least 1 of these was present on the prior radiograph of 2006 and appears unchanged. IMPRESSION: 1. Findings most consistent with acute cholecystitis with distention of the gallbladder with edema of the gallbladder wall and pericholecystic inflammatory changes. 2. Small right pleural effusion with atelectasis in right lung base. Electronically Signed   By: Francene Boyers M.D.   On: 10/19/2018 11:13   US Abdomen Limited  Result Date: 10/19/2018 CLINICAL DATA:  Possible acute cholecystitis EXAM: ULTRASOUND ABDOMEN LIMITED RIGHT UPPER QUADRANT COMPARISON:  CT scan October 19, 2018 FINDINGS: Gallbladder: Gallbladder wall thickening is identified, measuring up to 9 mm in thickness. Gallbladder sludge is identified with no stones. There is pericholecystic fluid and a positive Murphy's sign. Common bile duct: Diameter: 3.5 mm Liver: No focal lesion identified. Within normal limits in parenchymal echogenicity. Portal vein is patent on color Doppler imaging with normal direction of blood flow towards the liver. IMPRESSION: 1. Gallbladder wall thickening, pericholecystic fluid, gallbladder sludge, and a positive Murphy's sign are identified. No stones. The constellation of findings suggests acute cholecystitis in the appropriate clinical setting. If the clinical picture is ambiguous, a HIDA scan could further evaluate. Electronically Signed   By: Gerome Sam III M.D   On: 10/19/2018 14:27    Pending Labs Unresulted Labs (From admission, onward)    Start     Ordered   10/20/18 0500  CBC  Tomorrow morning,   R     10/19/18 1349   10/20/18 0500  Comprehensive metabolic panel  Tomorrow morning,   R     10/19/18 1349   10/19/18 1438  SARS  Coronavirus 2 (CEPHEID - Performed in [facility] lab),  Hosp Order  (Asymptomatic Patients Labs)  Once,   R    Question:  Rule Out  Answer:  Yes   10/19/18 1437   10/19/18 1438  Blood culture (routine x 2)  BLOOD CULTURE X 2,   STAT     10/19/18 1437   10/19/18 0911  Lactic acid, plasma  Now then every 2 hours,   STAT     10/19/18 0911   Signed and Held  SARS Coronavirus 2 (CEPHEID - Performed in [facility] lab), Hosp Order  ONCE - STAT,   R    Comments:  No isolation needed for this testing (if isolation ordered for another indication, maintain current isolation).   Question:  Pre-procedural testing  Answer:  Yes   Signed and Held          Vitals/Pain Today's Vitals   10/19/18 1200 10/19/18 1300 10/19/18 1502 10/19/18 1545  BP: (!) 147/87 (!) 172/96 (!) 141/80 (!) 141/72  Pulse: 90 (!) 102 96 (!) 101  Resp: (!) 26 (!) 30 (!) 28 (!) 34  Temp:    98.2 F (36.8 C)  TempSrc:    Oral  SpO2: 95% 96% 96% 96%  Weight:      Height:      PainSc:        Isolation Precautions No active isolations  Medications Medications  sodium chloride (PF) 0.9 % injection (has no administration in time range)  0.9 % NaCl with KCl 40 mEq / L  infusion (0 mL/hr Intravenous Hold 10/19/18 1530)  piperacillin-tazobactam (ZOSYN) IVPB 3.375 g (has no administration in time range)  sodium chloride 0.9 % bolus 1,000 mL (0 mLs Intravenous Stopped 10/19/18 1214)  iohexol (OMNIPAQUE) 300 MG/ML solution 100 mL (100 mLs Intravenous Contrast Given 10/19/18 1030)  potassium chloride 10 mEq in 100 mL IVPB (0 mEq Intravenous Stopped 10/19/18 1627)  piperacillin-tazobactam (ZOSYN) IVPB 3.375 g (0 g Intravenous Stopped 10/19/18 1627)    Mobility walks with person assist High fall risk   Focused Assessments Pulmonary Assessment Handoff:  Lung sounds:   O2 Device: Room Air        R Recommendations: See Admitting Provider Note  Report given to:   Additional Notes:

## 2018-10-19 NOTE — H&P (Signed)
History and Physical    Gary Neal IFB:[id number] DOB: [birth year] DOA: 10/19/2018  PCP: [facility] Patient coming from: Group home  Chief Complaint: RUQ pain  HPI: Gary Neal is a 58 y.o. male with medical history significant of seizures, hyperlipidemia, hypertension, mental disability.  Patient presents with a 1 day history of intermittent right upper quadrant pain.  He reports that he has not used any medication to help with his complaints.  He has no associated nausea, vomiting, diarrhea, fever, chills.  He reports one episode of watery stool last night.  This has not occurred in the past.  ED Course: Vitals: Afebrile, pulse 101, RR 34, BP 141/72, SpO2 96% Labs: Sodium of 122, potassium of 2.9, Creatinine of 1.69, AST of 68, WBC of 22 Imaging: CT abdomen pelvis significant for cholecystitis and right pleural effusion of right lung base Medications/Course: Zosyn, 1L NS bolus  Review of Systems: Review of Systems  Constitutional: Negative for chills and fever.  Respiratory: Positive for shortness of breath.   Cardiovascular: Negative for chest pain.  Gastrointestinal: Positive for abdominal pain and diarrhea. Negative for constipation, nausea and vomiting.  All other systems reviewed and are negative.   Past Medical History:  Diagnosis Date  . Seizures (HCC)     History reviewed. No pertinent surgical history.   reports that he has never smoked. He has never used smokeless tobacco. No history on file for alcohol and drug.  No Known Allergies  History reviewed. No pertinent family history.  Prior to Admission medications   Medication Sig Start Date End Date Taking? Authorizing Provider  acetaminophen (TYLENOL) 325 MG tablet Take 650 mg by mouth every 6 (six) hours as needed for mild pain or headache.   Yes [provider]  allopurinol (ZYLOPRIM) 100 MG tablet Take 100 mg by mouth daily.   Yes [provider]  cetirizine (ZYRTEC) 10 MG  tablet Take 10 mg by mouth daily.   Yes [provider]  clotrimazole-betamethasone (LOTRISONE) cream Apply 1 application topically 2 (two) times daily.   Yes [provider]  colchicine 0.6 MG tablet Take 0.6 mg by mouth daily.   Yes [provider]  Docusate Sodium (STOOL SOFTENER) 100 MG capsule Take 100 mg by mouth 2 (two) times daily.   Yes [provider]  donepezil (ARICEPT) 5 MG tablet Take 5 mg by mouth at bedtime.   Yes [provider]  ergocalciferol (VITAMIN D2) 1.25 MG (50000 UT) capsule Take 50,000 Units by mouth once a week.   Yes [provider]  lisinopril (PRINIVIL,ZESTRIL) 10 MG tablet Take 10 mg by mouth daily.   Yes [provider]  metoprolol succinate (TOPROL-XL) 25 MG 24 hr tablet Take 25 mg by mouth 2 (two) times a day.    Yes [provider]  primidone (MYSOLINE) 250 MG tablet Take 250 mg by mouth 3 (three) times daily.    Yes [provider]  simvastatin (ZOCOR) 20 MG tablet Take 20 mg by mouth daily.   Yes [provider]  triamterene-hydrochlorothiazide (DYAZIDE) 37.5-25 MG capsule Take 1 capsule by mouth daily.   Yes [provider]  albuterol (PROVENTIL HFA;VENTOLIN HFA) 108 (90 Base) MCG/ACT inhaler Inhale 2 puffs into the lungs every 4 (four) hours as needed for wheezing or shortness of breath. Patient not taking: Reported on 10/19/2018 10/05/16   Isa Rankin, MD  ondansetron (ZOFRAN) 4 MG tablet Take 2 tablets (8 mg total) by mouth every 4 (  four) hours as needed for nausea or vomiting. Patient not taking: Reported on 10/19/2018 10/05/16   Isa Rankin, MD  predniSONE (DELTASONE) 50 MG tablet Take 1 tablet (50 mg total) by mouth daily. Patient not taking: Reported on 10/19/2018 10/05/16   Isa Rankin, MD  triamcinolone (NASACORT) 55 MCG/ACT AERO nasal inhaler Place 2 sprays into the nose daily. Patient not taking: Reported on 10/19/2018 10/05/16   Isa Rankin, MD    Physical Exam:  Physical Exam Constitutional:      General: He is not in acute distress.    Appearance: He is well-developed. He is not diaphoretic.  Eyes:     Conjunctiva/sclera: Conjunctivae normal.     Pupils: Pupils are equal, round, and reactive to light.  Neck:     Musculoskeletal: Normal range of motion.  Cardiovascular:     Rate and Rhythm: Normal rate and regular rhythm.     Heart sounds: Normal heart sounds. No murmur.  Pulmonary:     Effort: Pulmonary effort is normal. Tachypnea present. No respiratory distress.     Breath sounds: Examination of the right-lower field reveals rales. Rales present. No decreased breath sounds or wheezing.  Abdominal:     General: Bowel sounds are normal. There is distension.     Palpations: Abdomen is soft.     Tenderness: There is abdominal tenderness in the right upper quadrant. There is no guarding or rebound.  Musculoskeletal: Normal range of motion.        General: No tenderness.     Right lower leg: Edema (Trace) present.     Left lower leg: Edema (Trace) present.  Lymphadenopathy:     Cervical: No cervical adenopathy.  Skin:    General: Skin is warm and dry.  Neurological:     Mental Status: He is alert and oriented to person, place, and time.    Labs on Admission: I have personally reviewed following labs and imaging studies  CBC: Recent Labs  Lab 10/19/18 0928  WBC 22.0*  NEUTROABS 18.1*  HGB 15.3  HCT 44.9  MCV 94.1  PLT 179    Basic Metabolic Panel: Recent Labs  Lab 10/19/18 0928  NA 122*  K 2.9*  CL 85*  CO2 22  GLUCOSE 119*  BUN 39*  CREATININE 1.69*  CALCIUM 7.5*  MG 2.1    GFR: Estimated Creatinine Clearance: 54.3 mL/min (A) (by C-G formula based on SCr of 1.69 mg/dL (H)).  Liver Function Tests: Recent Labs  Lab 10/19/18 0928  AST 68*  ALT 44  ALKPHOS 74  BILITOT 0.9  PROT 7.4  ALBUMIN 3.6   Recent Labs  Lab 10/19/18 0928  LIPASE 24   No results for  input(s): AMMONIA in the last 168 hours.  Coagulation Profile: No results for input(s): INR, PROTIME in the last 168 hours.  Cardiac Enzymes: No results for input(s): CKTOTAL, CKMB, CKMBINDEX, TROPONINI in the last 168 hours.  BNP (last 3 results) No results for input(s): PROBNP in the last 8760 hours.  HbA1C: No results for input(s): HGBA1C in the last 72 hours.  CBG: No results for input(s): GLUCAP in the last 168 hours.  Lipid Profile: No results for input(s): CHOL, HDL, LDLCALC, TRIG, CHOLHDL, LDLDIRECT in the last 72 hours.  Thyroid Function Tests: No results for input(s): TSH, T4TOTAL, FREET4, T3FREE, THYROIDAB in the last 72 hours.  Anemia Panel: No results for input(s): VITAMINB12, FOLATE, FERRITIN, TIBC, IRON, RETICCTPCT in the last 72 hours.  Urine analysis:  Component Value Date/Time   COLORURINE YELLOW 10/19/2018 0853   APPEARANCEUR CLEAR 10/19/2018 0853   LABSPEC 1.008 10/19/2018 0853   PHURINE 5.0 10/19/2018 0853   GLUCOSEU NEGATIVE 10/19/2018 0853   HGBUR SMALL (A) 10/19/2018 0853   BILIRUBINUR NEGATIVE 10/19/2018 0853   KETONESUR NEGATIVE 10/19/2018 0853   PROTEINUR NEGATIVE 10/19/2018 0853   NITRITE NEGATIVE 10/19/2018 0853   LEUKOCYTESUR NEGATIVE 10/19/2018 0853     Radiological Exams on Admission: Ct Abdomen Pelvis W Contrast  Result Date: 10/19/2018 CLINICAL DATA:  Acute abdominal pain.  Diarrhea. EXAM: CT ABDOMEN AND PELVIS WITH CONTRAST TECHNIQUE: Multidetector CT imaging of the abdomen and pelvis was performed using the standard protocol following bolus administration of intravenous contrast. CONTRAST:  OMNIPAQUE IOHEXOL 300 MG/ML  SOLN COMPARISON:  Radiographs dated 10/31/2004 FINDINGS: Lower chest: Small right pleural effusion. Focal atelectasis at the right lung base with a single 7 mm lymph node at the inferior aspect of the hilum. Heart appears normal. Hepatobiliary: The gallbladder is distended with edema in the gallbladder wall. No  defined stones. No biliary ductal dilatation. Liver parenchyma is normal. Small amount of free fluid around the gallbladder in the right pericolic gutter and surrounding the second portion of the duodenum. Pancreas: Unremarkable. No pancreatic ductal dilatation or surrounding inflammatory changes. Spleen: Normal in size without focal abnormality. Adrenals/Urinary Tract: Adrenal glands and kidneys are normal. No hydronephrosis. Small diverticulum in the right side of the bladder. The bladder is distended almost to the level of the umbilicus. Stomach/Bowel: There is slight soft tissue stranding around the second portion of the duodenum with slight prominence of the mucosa of the second portion of the duodenum. No discrete ulcer. The appendix is not discretely identified. The cecum lies in right mid abdomen Vascular/Lymphatic: No significant vascular findings are present. No enlarged abdominal or pelvic lymph nodes. Reproductive: Prostate is unremarkable. Other: No free air. 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 There is a small sclerotic lesions in the proximal sacrum which are felt to represent bone islands. At least 1 of these was present on the prior radiograph of 2006 and appears unchanged. IMPRESSION: 1. Findings most consistent with acute cholecystitis with distention of the gallbladder with edema of the gallbladder wall and pericholecystic inflammatory changes. 2. Small right pleural effusion with atelectasis in right lung base. Electronically Signed   By: Francene Boyers M.D.   On: 10/19/2018 11:13   US Abdomen Limited  Result Date: 10/19/2018 CLINICAL DATA:  Possible acute cholecystitis EXAM: ULTRASOUND ABDOMEN LIMITED RIGHT UPPER QUADRANT COMPARISON:  CT scan October 19, 2018 FINDINGS: Gallbladder: Gallbladder wall thickening is identified, measuring up to 9 mm in thickness. Gallbladder sludge is identified with no stones. There is pericholecystic fluid and a positive Murphy's sign. Common  bile duct: Diameter: 3.5 mm Liver: No focal lesion identified. Within normal limits in parenchymal echogenicity. Portal vein is patent on color Doppler imaging with normal direction of blood flow towards the liver. IMPRESSION: 1. Gallbladder wall thickening, pericholecystic fluid, gallbladder sludge, and a positive Murphy's sign are identified. No stones. The constellation of findings suggests acute cholecystitis in the appropriate clinical setting. If the clinical picture is ambiguous, a HIDA scan could further evaluate. Electronically Signed   By: Gerome Sam III M.D   On: 10/19/2018 14:27    EKG: Independently reviewed. Sinus tachycardia  Assessment/Plan Active Problems:   Acute cholecystitis  Acute cholecystitis General surgery consulted. Elevated WBC with some tachypnea. Meets sepsis criteria on admission.  -General surgery recommendations: possible  surgery in AM -Zosyn  Sepsis Likely secondary to above. -Management above -Blood cultures  Hyponatremia Patient does not look hypovolemic on exam. Possibly secondary to hydrochlorothiazide -For now, fluid restriction and hold hydrochlorothiazide -Obtain urine osmolality/sodium in addition to serum osmolality -BMP q4 hours  Tachypnea Associated rales on exam. CT shows effusion/atelectasis. -Chest x-ray (2 view) -Covered with Zosyn if pneumonia  Acute kidney injury Unknown etiology but in the setting of lisinopril/diuretic use. Patient, however, does not appear volume depleted. V -Hold lisinopril, triamterene and hydrochlorothiazide -Urine creatinine and other studies as above -BMP as above  Hyperlipidemia -Continue simvastatin  Hypertension On lisinopril, triamterene and hydrochlorothiazide as an outpatient -Hold medications as mentioned above -Continue metoprolol  Mental disability -Continue Aricept   DVT prophylaxis: Lovenox Code Status: Full code Family Communication: Sister on telephone Disposition Plan:  Telemetry Consults called: General surgery Admission status: Inpatient   Jacquelin Hawkingalph Ariyan Brisendine, MD [facility] 10/19/2018, 3:16 PM

## 2018-10-19 NOTE — ED Notes (Signed)
Bed: WA17 Expected date:  Expected time:  Means of arrival:  Comments: EMS 57yo M AMS

## 2018-10-19 NOTE — Consult Note (Addendum)
[facility] Surgery Consult Note  Gary Neal 08-09-1960  [id number].    Requesting MD: Ebbie Ridge PA-C  Chief Complaint: Right upper quadrant abdominal pain, incontinence, altered mental status  Reason for Consult: Abdominal pain  HPI: Patient is a 58 year old male from a group home.  He has a history of developmental delays.  He has been complaining of abdominal pain he has had some diarrhea for 3 days with some incontinence.  Staff reported that his mental status has changed;  he is normally more talkative and able to communicate normally.  He is also told the staff that he has burning when he voids and says his butt hole is hurting.  Work-up in the ED shows a white count of 22,000, hemoglobin 15, hematocrit 44, platelets 179,000.  Sodium is 122 potassium is 2.9 chloride is 85 glucose is 119 BUN is 39 creatinine is 1.69 calcium is 7.5.  Lipase is 24 AST 68, ALT 44, total bilirubin 0.9.  Lactate 1.7.  Urinalysis is unremarkable.  CT scan with contrast shows the gallbladder is distended with edema in the gallbladder wall no defined stones no biliary ductal dilatation.  Liver parenchyma is normal is small amount of free fluid around the gallbladder and the right peri-colic gutter and surrounding the second portion of the duodenum.  There is a small right pleural effusion with atelectasis.  We are asked to see.   In the ED he is alert and talkative.  He cannot give me any history.  He knows it is 2020, he knows Trump is president he knows he lives in a group home.  He could not tell me why he is here.  He does not remember why he came to the hospital.  He has no memory of diarrhea, he does point to his RUQ, and right chest and say it hurts.  He is somewhat tender in the right lower quadrant and left upper quadrant.  I spoke with his sister who had him over the weekend.  He was complaining of the right side, moaning, but  eating OK over the weekend.  His sister is not aware of diarrhea, he  did have a stool incontinence at Group home and was combative this AM which is new.  ( from his sister)  2:30 AM this morning he was having a lot of pain.  The group manager says he was moaning and was just not himself.  She gave him some Tylenol; he then became incontinent, and more confused and agitated.  She had to get help with him.  He has been complaining of his side hurting since he came back to the group home Sunday PM.  He was fine Friday when he left the home to visit with his sister. ( this information is from the group home manager.)  No fever or other symptoms reported by Group manager.  ROS: Review of Systems  Unable to perform ROS: Mental acuity    History reviewed. No pertinent family history.  Past Medical History:  Diagnosis Date  . Seizures (HCC)  None if 15 years (grand Mal) Hx of shingles     History reviewed. No pertinent surgical history.  Social History:  reports that he has never smoked. He has never used smokeless tobacco. No history on file for alcohol and drug.  Allergies: No Known Allergies  Prior to Admission medications   His sister is not aware of all his current medicines He is taking meds listed below except for the Proventil &  nasocort per Group home Production designer, theatre/television/film.  Medication Sig Start Date End Date Taking? Authorizing Provider  albuterol (PROVENTIL HFA;VENTOLIN HFA) 108 (90 Base) MCG/ACT inhaler Inhale 2 puffs into the lungs every 4 (four) hours as needed for wheezing or shortness of breath. 10/05/16 Not taking  Isa Rankin, MD  allopurinol (ZYLOPRIM) 100 MG tablet Take 100 mg by mouth daily.    [provider]  cetirizine (ZYRTEC) 10 MG tablet Take 10 mg by mouth daily.    [provider]  clotrimazole-betamethasone (LOTRISONE) cream Apply 1 application topically 2 (two) times daily.    [provider]  colchicine 0.6 MG tablet Take 0.6 mg by mouth daily.    [provider]  Docusate Sodium (STOOL SOFTENER) 100  MG capsule Take 100 mg by mouth 2 (two) times daily.    [provider]  Ergocalciferol (VITAMIN D2 PO) Take by mouth.    [provider]  lisinopril (PRINIVIL,ZESTRIL) 10 MG tablet Take 10 mg by mouth daily.    [provider]  metoprolol succinate (TOPROL-XL) 25 MG 24 hr tablet Take 25 mg by mouth daily.    [provider]  nystatin cream (MYCOSTATIN) Apply 1 application topically 2 (two) times daily.    [provider]  ondansetron (ZOFRAN) 4 MG tablet Take 2 tablets (8 mg total) by mouth every 4 (four) hours as needed for nausea or vomiting. 10/05/16   Isa Rankin, MD  predniSONE (DELTASONE) 50 MG tablet Take 1 tablet (50 mg total) by mouth daily. 10/05/16 For seizures No seizure in years  Isa Rankin, MD  primidone (MYSOLINE) 250 MG tablet Take 250 mg by mouth 4 (four) times daily.  For seizures  [provider]  simvastatin (ZOCOR) 20 MG tablet Take 20 mg by mouth daily.    [provider]  triamcinolone (NASACORT) 55 MCG/ACT AERO nasal inhaler Place 2 sprays into the nose daily. 10/05/16 Not taking  Isa Rankin, MD  triamterene-hydrochlorothiazide (DYAZIDE) 37.5-25 MG capsule Take 1 capsule by mouth daily.    [provider]      Blood pressure (!) 147/87, pulse 90, temperature 98.4 F (36.9 C), temperature source Oral, resp. rate (!) 26, height  (1.803 m), weight 90.7 kg, SpO2 95 %. Physical Exam: Physical Exam Constitutional:      General: He is not in acute distress.    Appearance: He is obese. He is ill-appearing. He is not toxic-appearing.  HENT:     Head: Normocephalic and atraumatic.     Mouth/Throat:     Mouth: Mucous membranes are moist.  Eyes:     General: No scleral icterus. Cardiovascular:     Rate and Rhythm: Regular rhythm. Tachycardia present.     Heart sounds: No murmur.  Pulmonary:     Effort: Pulmonary effort is normal. No respiratory distress.     Breath  sounds: Normal breath sounds. No stridor. No wheezing, rhonchi or rales.  Chest:     Chest wall: Tenderness (Points to the right lower chest just above his right upper quadrant.) present.  Abdominal:     General: Abdomen is protuberant. Bowel sounds are decreased. There is distension. There is no abdominal bruit. There are no signs of injury.     Palpations: Abdomen is soft. There is no shifting dullness, fluid wave, hepatomegaly, splenomegaly, mass or pulsatile mass.     Tenderness: There is abdominal tenderness in the right upper quadrant, right lower quadrant and left upper quadrant. There is  no right CVA tenderness, left CVA tenderness, guarding or rebound. Negative signs include Murphy's sign, Rovsing's sign, McBurney's sign, psoas sign and obturator sign.     Hernia: No hernia is present.  Skin:    General: Skin is dry.     Capillary Refill: Capillary refill takes less than 2 seconds.     Coloration: Skin is not cyanotic, jaundiced or pale.     Findings: No erythema or rash.  Neurological:     General: No focal deficit present.     Mental Status: He is alert.     Cranial Nerves: No cranial nerve deficit.  Psychiatric:        Mood and Affect: Mood normal.        Behavior: Behavior normal.     Results for orders placed or performed during the hospital encounter of 10/19/18 (from the past 48 hour(s))  Urinalysis, Routine w reflex microscopic     Status: Abnormal   Collection Time: 10/19/18  8:53 AM  Result Value Ref Range   Color, Urine YELLOW YELLOW   APPearance CLEAR CLEAR   Specific Gravity, Urine 1.008 1.005 - 1.030   pH 5.0 5.0 - 8.0   Glucose, UA NEGATIVE NEGATIVE mg/dL   Hgb urine dipstick SMALL (A) NEGATIVE   Bilirubin Urine NEGATIVE NEGATIVE   Ketones, ur NEGATIVE NEGATIVE mg/dL   Protein, ur NEGATIVE NEGATIVE mg/dL   Nitrite NEGATIVE NEGATIVE   Leukocytes,Ua NEGATIVE NEGATIVE   WBC, UA 0-5 0 - 5 WBC/hpf   Bacteria, UA RARE (A) NONE SEEN   Squamous Epithelial / LPF  0-5 0 - 5   Mucus PRESENT     Comment: Performed at [facility], [address] [place], [place] [address]  Comprehensive metabolic panel     Status: Abnormal   Collection Time: 10/19/18  9:28 AM  Result Value Ref Range   Sodium 122 (L) 135 - 145 mmol/L   Potassium 2.9 (L) 3.5 - 5.1 mmol/L   Chloride 85 (L) 98 - 111 mmol/L   CO2 22 22 - 32 mmol/L   Glucose, Bld 119 (H) 70 - 99 mg/dL   BUN 39 (H) 6 - 20 mg/dL   Creatinine, Ser 6.72 (H) 0.61 - 1.24 mg/dL   Calcium 7.5 (L) 8.9 - 10.3 mg/dL   Total Protein 7.4 6.5 - 8.1 g/dL   Albumin 3.6 3.5 - 5.0 g/dL   AST 68 (H) 15 - 41 U/L   ALT 44 0 - 44 U/L   Alkaline Phosphatase 74 38 - 126 U/L   Total Bilirubin 0.9 0.3 - 1.2 mg/dL   GFR calc non Af Amer 43 (L) >60 mL/min   GFR calc Af Amer 50 (L) >60 mL/min   Anion gap 15 5 - 15    Comment: Performed at [facility], [address], [place], [place] [address]  CBC with Differential     Status: Abnormal   Collection Time: 10/19/18  9:28 AM  Result Value Ref Range   WBC 22.0 (H) 4.0 - 10.5 K/uL   RBC 4.77 4.22 - 5.81 MIL/uL   Hemoglobin 15.3 13.0 - 17.0 g/dL   HCT 96.2 83.6 - 62.9 %   MCV 94.1 80.0 - 100.0 fL   MCH 32.1 26.0 - 34.0 pg   MCHC 34.1 30.0 - 36.0 g/dL   RDW 47.6 54.6 - 50.3 %   Platelets 179 150 - 400 K/uL   nRBC 0.0 0.0 - 0.2 %   Neutrophils Relative %  83 %   Neutro Abs 18.1 (H) 1.7 - 7.7 K/uL   Lymphocytes Relative 5 %   Lymphs Abs 1.1 0.7 - 4.0 K/uL   Monocytes Relative 11 %   Monocytes Absolute 2.5 (H) 0.1 - 1.0 K/uL   Eosinophils Relative 0 %   Eosinophils Absolute 0.1 0.0 - 0.5 K/uL   Basophils Relative 0 %   Basophils Absolute 0.0 0.0 - 0.1 K/uL   Immature Granulocytes 1 %   Abs Immature Granulocytes 0.21 (H) 0.00 - 0.07 K/uL    Comment: Performed at [facility], [address] [place], [place] [address]  Lactic acid, plasma     Status: None   Collection Time: 10/19/18  9:28 AM  Result Value Ref  Range   Lactic Acid, Venous 1.7 0.5 - 1.9 mmol/L    Comment: Performed at [facility], [address] [place], [place] [address]  Lipase, blood     Status: None   Collection Time: 10/19/18  9:28 AM  Result Value Ref Range   Lipase 24 11 - 51 U/L    Comment: Performed at [facility], [address] [place], [place] [address]   Ct Abdomen Pelvis W Contrast  Result Date: 10/19/2018 CLINICAL DATA:  Acute abdominal pain.  Diarrhea. EXAM: CT ABDOMEN AND PELVIS WITH CONTRAST TECHNIQUE: Multidetector CT imaging of the abdomen and pelvis was performed using the standard protocol following bolus administration of intravenous contrast. CONTRAST:  100mL OMNIPAQUE IOHEXOL 300 MG/ML  SOLN COMPARISON:  Radiographs dated 10/31/2004 FINDINGS: Lower chest: Small right pleural effusion. Focal atelectasis at the right lung base with a single 7 mm lymph node at the inferior aspect of the hilum. Heart appears normal. Hepatobiliary: The gallbladder is distended with edema in the gallbladder wall. No defined stones. No biliary ductal dilatation. Liver parenchyma is normal. Small amount of free fluid around the gallbladder in the right pericolic gutter and surrounding the second portion of the duodenum. Pancreas: Unremarkable. No pancreatic ductal dilatation or surrounding inflammatory changes. Spleen: Normal in size without focal abnormality. Adrenals/Urinary Tract: Adrenal glands and kidneys are normal. No hydronephrosis. Small diverticulum in the right side of the bladder. The bladder is distended almost to the level of the umbilicus. Stomach/Bowel: There is slight soft tissue stranding around the second portion of the duodenum with slight prominence of the mucosa of the second portion of the duodenum. No discrete ulcer. The appendix is not discretely identified. The cecum lies in right mid abdomen Vascular/Lymphatic: No significant vascular findings are present. No enlarged abdominal  or pelvic lymph nodes. Reproductive: Prostate is unremarkable. Other: No free air. 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 There is a small sclerotic lesions in the proximal sacrum which are felt to represent bone islands. At least 1 of these was present on the prior radiograph of 2006 and appears unchanged. IMPRESSION: 1. Findings most consistent with acute cholecystitis with distention of the gallbladder with edema of the gallbladder wall and pericholecystic inflammatory changes. 2. Small right pleural effusion with atelectasis in right lung base. Electronically Signed   By: Francene BoyersJames  Maxwell M.D.   On: 10/19/2018 11:13    POC:  Dyann KiefGibson, Lisha Sister   [phone]  Coltraine,Lathay Other [phone] [phone] >> land line  group home manager - voice mail when called 6/1/201:20 PM  I have talked to both the sister [name] and Group home manager [employer] Coltraine.    Assessment/plan  Sepsis  Probable cholecystitis Acute kidney injury  Hyponatremia Hypokalemia -  K+ 2.9; mag 2.1 AMS - per EMS/group home leader and sister Hx of developmental delay - lives in group home Hx of seizure - on Prednisone/Mysoline - no seizures for 15 years per sister. Hypertension/homemedicine list Gout Hyperlipidemia   Plan:  Admit to Medicine, correct electrolytes, monitor renal fuction.  Hydrate, he needs a COVID test, abd ultrasound  I would put him on Zosyn for now.  If he is medically stable and cleared we will plan to take his GB out tomorrow pending [place] and his progress over night.  His bladder is huge and I have recommended they insert a foley.   Sherrie George [facility] Surgery 10/19/2018, 12:43 PM Pager: [phone] Consults: [phone]

## 2018-10-19 NOTE — ED Notes (Signed)
Patient stated he has burning when he pees. Patient also stated that his butt hole is hurting.

## 2018-10-19 NOTE — ED Notes (Signed)
EKG given to Wentz MD 

## 2018-10-20 ENCOUNTER — Encounter (HOSPITAL_COMMUNITY)
Admission: EM | Disposition: A | Discharge status: Home / Self Care | Payer: Self-pay | Source: Home / Self Care | Attending: Internal Medicine

## 2018-10-20 ENCOUNTER — Inpatient Hospital Stay (HOSPITAL_COMMUNITY): Payer: Medicare HMO | Admitting: Certified Registered"

## 2018-10-20 DIAGNOSIS — E871 Hypo-osmolality and hyponatremia: Secondary | ICD-10-CM

## 2018-10-20 DIAGNOSIS — I10 Essential (primary) hypertension: Secondary | ICD-10-CM

## 2018-10-20 DIAGNOSIS — N179 Acute kidney failure, unspecified: Secondary | ICD-10-CM | POA: Diagnosis present

## 2018-10-20 DIAGNOSIS — R0682 Tachypnea, not elsewhere classified: Secondary | ICD-10-CM

## 2018-10-20 DIAGNOSIS — A419 Sepsis, unspecified organism: Secondary | ICD-10-CM | POA: Diagnosis present

## 2018-10-20 DIAGNOSIS — J90 Pleural effusion, not elsewhere classified: Secondary | ICD-10-CM | POA: Diagnosis present

## 2018-10-20 DIAGNOSIS — E785 Hyperlipidemia, unspecified: Secondary | ICD-10-CM | POA: Diagnosis present

## 2018-10-20 DIAGNOSIS — F79 Unspecified intellectual disabilities: Secondary | ICD-10-CM

## 2018-10-20 HISTORY — DX: Acute kidney failure, unspecified: N17.9

## 2018-10-20 HISTORY — PX: CHOLECYSTECTOMY: SHX55

## 2018-10-20 HISTORY — DX: Sepsis, unspecified organism: A41.9

## 2018-10-20 HISTORY — DX: Hypo-osmolality and hyponatremia: E87.1

## 2018-10-20 LAB — COMPREHENSIVE METABOLIC PANEL
ALT: 35 U/L (ref 0–44)
AST: 48 U/L — ABNORMAL HIGH (ref 15–41)
Albumin: 3 g/dL — ABNORMAL LOW (ref 3.5–5.0)
Alkaline Phosphatase: 63 U/L (ref 38–126)
Anion gap: 14 (ref 5–15)
BUN: 28 mg/dL — ABNORMAL HIGH (ref 6–20)
CO2: 18 mmol/L — ABNORMAL LOW (ref 22–32)
Calcium: 7.7 mg/dL — ABNORMAL LOW (ref 8.9–10.3)
Chloride: 96 mmol/L — ABNORMAL LOW (ref 98–111)
Creatinine, Ser: 1.37 mg/dL — ABNORMAL HIGH (ref 0.61–1.24)
GFR calc Af Amer: >60 mL/min (ref >60)
GFR calc non Af Amer: 56 mL/min — ABNORMAL LOW (ref >60)
Glucose, Bld: 104 mg/dL — ABNORMAL HIGH (ref 70–99)
Potassium: 3.5 mmol/L (ref 3.5–5.1)
Sodium: 128 mmol/L — ABNORMAL LOW (ref 135–145)
Total Bilirubin: 0.9 mg/dL (ref 0.3–1.2)
Total Protein: 6.6 g/dL (ref 6.5–8.1)

## 2018-10-20 LAB — BASIC METABOLIC PANEL
Anion gap: 13 (ref 5–15)
BUN: 30 mg/dL — ABNORMAL HIGH (ref 6–20)
CO2: 21 mmol/L — ABNORMAL LOW (ref 22–32)
Calcium: 7.9 mg/dL — ABNORMAL LOW (ref 8.9–10.3)
Chloride: 100 mmol/L (ref 98–111)
Creatinine, Ser: 1.3 mg/dL — ABNORMAL HIGH (ref 0.61–1.24)
GFR calc Af Amer: >60 mL/min (ref >60)
GFR calc non Af Amer: 60 mL/min — ABNORMAL LOW (ref >60)
Glucose, Bld: 91 mg/dL (ref 70–99)
Potassium: 3.9 mmol/L (ref 3.5–5.1)
Sodium: 134 mmol/L — ABNORMAL LOW (ref 135–145)

## 2018-10-20 LAB — CBC
HCT: 41 % (ref 39.0–52.0)
Hemoglobin: 13.8 g/dL (ref 13.0–17.0)
MCH: 32.1 pg (ref 26.0–34.0)
MCHC: 33.7 g/dL (ref 30.0–36.0)
MCV: 95.3 fL (ref 80.0–100.0)
Platelets: 175 10*3/uL (ref 150–400)
RBC: 4.3 MIL/uL (ref 4.22–5.81)
RDW: 13.8 % (ref 11.5–15.5)
WBC: 16.2 10*3/uL — ABNORMAL HIGH (ref 4.0–10.5)
nRBC: 0 % (ref 0.0–0.2)

## 2018-10-20 LAB — HIV ANTIBODY (ROUTINE TESTING W REFLEX): HIV Screen 4th Generation wRfx: NONREACTIVE

## 2018-10-20 SURGERY — LAPAROSCOPIC CHOLECYSTECTOMY WITH INTRAOPERATIVE CHOLANGIOGRAM
Anesthesia: General

## 2018-10-20 MED ORDER — FENTANYL CITRATE (PF) 100 MCG/2ML IJ SOLN
INTRAMUSCULAR | Status: AC
  Filled 2018-10-20: qty 2

## 2018-10-20 MED ORDER — ROCURONIUM BROMIDE 10 MG/ML (PF) SYRINGE
PREFILLED_SYRINGE | INTRAVENOUS | Status: AC
  Filled 2018-10-20: qty 10

## 2018-10-20 MED ORDER — FENTANYL CITRATE (PF) 100 MCG/2ML IJ SOLN: 25.0000 ug | INTRAMUSCULAR | Status: DC | PRN

## 2018-10-20 MED ORDER — ESMOLOL HCL 100 MG/10ML IV SOLN
INTRAVENOUS | Status: AC
  Filled 2018-10-20: qty 10

## 2018-10-20 MED ORDER — KETOROLAC TROMETHAMINE 30 MG/ML IJ SOLN
30.0000 mg | Freq: Four times a day (QID) | INTRAMUSCULAR | Status: DC | PRN
  Administered 2018-10-20: 30 mg via INTRAVENOUS
  Filled 2018-10-20: qty 1

## 2018-10-20 MED ORDER — DEXAMETHASONE SODIUM PHOSPHATE 10 MG/ML IJ SOLN
INTRAMUSCULAR | Status: DC | PRN
  Administered 2018-10-20: 8 mg via INTRAVENOUS

## 2018-10-20 MED ORDER — ONDANSETRON 4 MG PO TBDP: 4.0000 mg | ORAL_TABLET | Freq: Four times a day (QID) | ORAL | Status: DC | PRN

## 2018-10-20 MED ORDER — ESMOLOL HCL 100 MG/10ML IV SOLN
INTRAVENOUS | Status: DC | PRN
  Administered 2018-10-20: 30 mg via INTRAVENOUS
  Administered 2018-10-20: 20 mg via INTRAVENOUS
  Administered 2018-10-20: 30 mg via INTRAVENOUS

## 2018-10-20 MED ORDER — BUPIVACAINE-EPINEPHRINE (PF) 0.25% -1:200000 IJ SOLN
INTRAMUSCULAR | Status: DC | PRN
  Administered 2018-10-20: 10 mL

## 2018-10-20 MED ORDER — FENTANYL CITRATE (PF) 250 MCG/5ML IJ SOLN
INTRAMUSCULAR | Status: AC
  Filled 2018-10-20: qty 5

## 2018-10-20 MED ORDER — PIPERACILLIN-TAZOBACTAM 3.375 G IVPB
3.3750 g | Freq: Three times a day (TID) | INTRAVENOUS | Status: AC
  Administered 2018-10-20 – 2018-10-21 (×3): 3.375 g via INTRAVENOUS
  Filled 2018-10-20 (×3): qty 50

## 2018-10-20 MED ORDER — SUGAMMADEX SODIUM 200 MG/2ML IV SOLN
INTRAVENOUS | Status: DC | PRN
  Administered 2018-10-20: 200 mg via INTRAVENOUS

## 2018-10-20 MED ORDER — PROPOFOL 10 MG/ML IV BOLUS
INTRAVENOUS | Status: DC | PRN
  Administered 2018-10-20: 200 mg via INTRAVENOUS

## 2018-10-20 MED ORDER — ONDANSETRON HCL 4 MG/2ML IJ SOLN
INTRAMUSCULAR | Status: AC
  Filled 2018-10-20: qty 2

## 2018-10-20 MED ORDER — EPHEDRINE SULFATE-NACL 50-0.9 MG/10ML-% IV SOSY
PREFILLED_SYRINGE | INTRAVENOUS | Status: DC | PRN
  Administered 2018-10-20: 10 mg via INTRAVENOUS

## 2018-10-20 MED ORDER — FENTANYL CITRATE (PF) 250 MCG/5ML IJ SOLN
INTRAMUSCULAR | Status: DC | PRN
  Administered 2018-10-20: 50 ug via INTRAVENOUS
  Administered 2018-10-20: 100 ug via INTRAVENOUS
  Administered 2018-10-20: 50 ug via INTRAVENOUS
  Administered 2018-10-20: 100 ug via INTRAVENOUS
  Administered 2018-10-20: 50 ug via INTRAVENOUS

## 2018-10-20 MED ORDER — MIDAZOLAM HCL 2 MG/2ML IJ SOLN
INTRAMUSCULAR | Status: DC | PRN
  Administered 2018-10-20 (×2): 1 mg via INTRAVENOUS

## 2018-10-20 MED ORDER — EPHEDRINE 5 MG/ML INJ
INTRAVENOUS | Status: AC
  Filled 2018-10-20: qty 10

## 2018-10-20 MED ORDER — SUGAMMADEX SODIUM 200 MG/2ML IV SOLN
INTRAVENOUS | Status: AC
  Filled 2018-10-20: qty 2

## 2018-10-20 MED ORDER — OXYCODONE HCL 5 MG/5ML PO SOLN: 5.0000 mg | Freq: Once | ORAL | Status: DC | PRN

## 2018-10-20 MED ORDER — LACTATED RINGERS IR SOLN
Status: DC | PRN
  Administered 2018-10-20: 1000 mL

## 2018-10-20 MED ORDER — ONDANSETRON HCL 4 MG/2ML IJ SOLN: 4.0000 mg | Freq: Four times a day (QID) | INTRAMUSCULAR | Status: DC | PRN

## 2018-10-20 MED ORDER — HYDROMORPHONE HCL 1 MG/ML IJ SOLN: 0.5000 mg | INTRAMUSCULAR | Status: DC | PRN

## 2018-10-20 MED ORDER — BUPIVACAINE-EPINEPHRINE (PF) 0.25% -1:200000 IJ SOLN
INTRAMUSCULAR | Status: AC
  Filled 2018-10-20: qty 30

## 2018-10-20 MED ORDER — ONDANSETRON HCL 4 MG/2ML IJ SOLN: 4.0000 mg | Freq: Once | INTRAMUSCULAR | Status: DC | PRN

## 2018-10-20 MED ORDER — DEXTROSE-NACL 5-0.9 % IV SOLN
INTRAVENOUS | Status: DC
  Administered 2018-10-20 – 2018-10-21 (×2): via INTRAVENOUS

## 2018-10-20 MED ORDER — PROPOFOL 10 MG/ML IV BOLUS
INTRAVENOUS | Status: AC
  Filled 2018-10-20: qty 20

## 2018-10-20 MED ORDER — LIDOCAINE 2% (20 MG/ML) 5 ML SYRINGE
INTRAMUSCULAR | Status: DC | PRN
  Administered 2018-10-20: 60 mg via INTRAVENOUS

## 2018-10-20 MED ORDER — DEXAMETHASONE SODIUM PHOSPHATE 10 MG/ML IJ SOLN
INTRAMUSCULAR | Status: AC
  Filled 2018-10-20: qty 1

## 2018-10-20 MED ORDER — ONDANSETRON HCL 4 MG/2ML IJ SOLN
INTRAMUSCULAR | Status: DC | PRN
  Administered 2018-10-20: 4 mg via INTRAVENOUS

## 2018-10-20 MED ORDER — SUCCINYLCHOLINE CHLORIDE 200 MG/10ML IV SOSY
PREFILLED_SYRINGE | INTRAVENOUS | Status: DC | PRN
  Administered 2018-10-20: 120 mg via INTRAVENOUS

## 2018-10-20 MED ORDER — ROCURONIUM BROMIDE 10 MG/ML (PF) SYRINGE
PREFILLED_SYRINGE | INTRAVENOUS | Status: DC | PRN
  Administered 2018-10-20: 40 mg via INTRAVENOUS

## 2018-10-20 MED ORDER — LIDOCAINE 2% (20 MG/ML) 5 ML SYRINGE
INTRAMUSCULAR | Status: AC
  Filled 2018-10-20: qty 5

## 2018-10-20 MED ORDER — OXYCODONE HCL 5 MG PO TABS: 5.0000 mg | ORAL_TABLET | Freq: Once | ORAL | Status: DC | PRN

## 2018-10-20 MED ORDER — MIDAZOLAM HCL 2 MG/2ML IJ SOLN
INTRAMUSCULAR | Status: AC
  Filled 2018-10-20: qty 2

## 2018-10-20 MED ORDER — LACTATED RINGERS IV SOLN
INTRAVENOUS | Status: DC
  Administered 2018-10-20: 10:00:00 via INTRAVENOUS

## 2018-10-20 SURGICAL SUPPLY — 33 items
ADH SKN CLS APL DERMABOND .7 (GAUZE/BANDAGES/DRESSINGS) ×1
APL PRP STRL LF DISP 70% ISPRP (MISCELLANEOUS) ×1
APPLIER CLIP 5 13 M/L LIGAMAX5 (MISCELLANEOUS)
APR CLP MED LRG 5 ANG JAW (MISCELLANEOUS)
BAG SPEC RTRVL 10 TROC 200 (ENDOMECHANICALS) ×1
CABLE HIGH FREQUENCY MONO STRZ (ELECTRODE) ×3 IMPLANT
CHLORAPREP W/TINT 26 (MISCELLANEOUS) ×3 IMPLANT
CLIP APPLIE 5 13 M/L LIGAMAX5 (MISCELLANEOUS) IMPLANT
CLIP VESOLOCK MED LG 6/CT (CLIP) ×4 IMPLANT
DECANTER SPIKE VIAL GLASS SM (MISCELLANEOUS) ×3 IMPLANT
DERMABOND ADVANCED (GAUZE/BANDAGES/DRESSINGS) ×2
DERMABOND ADVANCED .7 DNX12 (GAUZE/BANDAGES/DRESSINGS) ×2 IMPLANT
DEVICE TROCAR PUNCTURE CLOSURE (ENDOMECHANICALS) IMPLANT
ELECT REM PT RETURN 15FT ADLT (MISCELLANEOUS) ×3 IMPLANT
GAUZE SPONGE 2X2 8PLY STRL LF (GAUZE/BANDAGES/DRESSINGS) ×1 IMPLANT
GLOVE BIO SURGEON STRL SZ7.5 (GLOVE) ×3 IMPLANT
GOWN STRL REUS W/TWL XL LVL3 (GOWN DISPOSABLE) ×9 IMPLANT
GRASPER SUT TROCAR 14GX15 (MISCELLANEOUS) ×2 IMPLANT
HEMOSTAT SURGICEL 4X8 (HEMOSTASIS) ×2 IMPLANT
KIT BASIN OR (CUSTOM PROCEDURE TRAY) ×3 IMPLANT
KIT TURNOVER KIT A (KITS) IMPLANT
POUCH RETRIEVAL ECOSAC 10 (ENDOMECHANICALS) IMPLANT
POUCH RETRIEVAL ECOSAC 10MM (ENDOMECHANICALS) ×2
SCISSORS LAP 5X35 DISP (ENDOMECHANICALS) ×3 IMPLANT
SET IRRIG TUBING LAPAROSCOPIC (IRRIGATION / IRRIGATOR) ×3 IMPLANT
SET TUBE SMOKE EVAC HIGH FLOW (TUBING) ×3 IMPLANT
SLEEVE XCEL OPT CAN 5 100 (ENDOMECHANICALS) ×3 IMPLANT
SPONGE GAUZE 2X2 STER 10/PKG (GAUZE/BANDAGES/DRESSINGS) ×2
SUT MNCRL AB 4-0 PS2 18 (SUTURE) ×3 IMPLANT
TOWEL OR 17X26 10 PK STRL BLUE (TOWEL DISPOSABLE) ×3 IMPLANT
TRAY LAPAROSCOPIC (CUSTOM PROCEDURE TRAY) ×3 IMPLANT
TROCAR BLADELESS OPT 5 100 (ENDOMECHANICALS) ×3 IMPLANT
TROCAR XCEL NON-BLD 11X100MML (ENDOMECHANICALS) ×3 IMPLANT

## 2018-10-20 NOTE — Progress Notes (Signed)
Progress Note    Gary Neal  WUJ:[id number] DOB: 11/30/1960  DOA: 10/19/2018 PCP: [facility]    Brief Narrative:   Chief complaint: Follow-up right upper quadrant pain.  Medical records reviewed and are as summarized below:  Gary Neal is an 58 y.o. male with a PMH of seizures, hyperlipidemia, hypertension, and mental disability who was admitted 10/19/2018 for evaluation of right upper quadrant pain associated with one episode of watery stools.  Upon initial evaluation in the ED, vital signs were stable and he was noted to have a sodium of 122, potassium 2.9, creatinine 1.69T 68, WBC 22 with CT of the abdomen and pelvis showing cholecystitis and a right pleural effusion.  He was given 1 L of normal saline and placed on empiric Zosyn.  Assessment/Plan:   Principal Problem:   Sepsis (HCC) secondary to acute cholecystitis Criteria on admission with significant leukocytosis, low-grade fever, tachypnea and tachycardia.  CT and abdominal ultrasound suggestive of acute cholecystitis, personally reviewed.  Source likely acute cholecystitis although right lobar pneumonia cannot be completely ruled out.  Surgery consulted with plans for cholecystectomy.  Follow-up blood cultures.  Active Problems:   Pleural effusion on right associated with tachypnea Images personally reviewed.  Right basilar effusion with atelectasis apparent.  Continues to be tachypneic but maintaining oxygen saturations well.      Hyponatremia Likely from Dyazide in the setting of dehydration.  Sodium improving with rehydration and holding thiazide diuretics.    AKI (acute kidney injury) (HCC) Likely from dehydration in the setting of Dyazide and lisinopril use.  These medications have been held and the patient is being hydrated.  Renal function slowly improving.    Hyperlipidemia Continue Zocor.    Essential hypertension Blood pressure currently controlled.  Continue metoprolol.  Continue to hold  Dyazide and lisinopril.    Mental disability Continue Aricept.  Family Communication/Anticipated D/C date and plan/Code Status   DVT prophylaxis: Lovenox ordered. Code Status: Full Code.  Family Communication: Patient communicates with family. Disposition Plan: Possibly home 10/21/18 if stable.  Medical Consultants:    Surgery   Anti-Infectives:    Zosyn 10/19/2018--->  Subjective:   Patient reports RUQ pain, severe.  No current nausea, vomiting or SOB.  Getting ready for surgery.  Objective:    Vitals:   10/19/18 1545 10/19/18 1731 10/19/18 2025 10/20/18 0454  BP: (!) 141/72 (!) 150/86 111/61 139/81  Pulse: (!) 101 (!) 106 (!) 108 95  Resp: (!) 34 (!) 24  18  Temp: 98.2 F (36.8 C) 99 F (37.2 C) 99.4 F (37.4 C) 98.4 F (36.9 C)  TempSrc: Oral  Oral Oral  SpO2: 96% 100% 98% 99%  Weight:      Height:        Intake/Output Summary (Last 24 hours) at 10/20/2018 0754 Last data filed at 10/20/2018 0600 Gross per 24 hour  Intake 1689.59 ml  Output 1475 ml  Net 214.59 ml   Filed Weights   10/19/18 0849  Weight: 90.7 kg    Exam: General: No acute distress. Cardiovascular: Heart sounds show a regular rate, and rhythm. No gallops or rubs. No murmurs. No JVD. Lungs: Clear to auscultation bilaterally with good air movement. No rales, rhonchi or wheezes. Abdomen: RUQ pain/tenderness. No masses. No hepatosplenomegaly. Neurological: Alert and oriented 3. Moves all extremities 4 with equal strength. Cranial nerves II through XII grossly intact. Skin: Warm and dry. No rashes or lesions. Extremities: No clubbing or cyanosis. No edema. Pedal  pulses 2+. Psychiatric: Mood and affect are normal. Insight and judgment are normal.   Data Reviewed:   I have personally reviewed following labs and imaging studies:  Labs: Labs show the following:   Basic Metabolic Panel: Recent Labs  Lab 10/19/18 0928 10/19/18 1827 10/19/18 1955 10/20/18 0020  NA 122* 128* 126* 128*    K 2.9* 3.5 3.5 3.5  CL 85* 93* 95* 96*  CO2 22 20* 19* 18*  GLUCOSE 119* 105* 118* 104*  BUN 39* 29* 29* 28*  CREATININE 1.69* 1.39* 1.28* 1.37*  CALCIUM 7.5* 7.7* 7.5* 7.7*  MG 2.1  --   --   --    GFR Estimated Creatinine Clearance: 66.9 mL/min (A) (by C-G formula based on SCr of 1.37 mg/dL (H)). Liver Function Tests: Recent Labs  Lab 10/19/18 0928 10/20/18 0020  AST 68* 48*  ALT 44 35  ALKPHOS 74 63  BILITOT 0.9 0.9  PROT 7.4 6.6  ALBUMIN 3.6 3.0*   Recent Labs  Lab 10/19/18 0928  LIPASE 24   CBC: Recent Labs  Lab 10/19/18 0928 10/20/18 0020  WBC 22.0* 16.2*  NEUTROABS 18.1*  --   HGB 15.3 13.8  HCT 44.9 41.0  MCV 94.1 95.3  PLT 179 175   Sepsis Labs: Recent Labs  Lab 10/19/18 0928 10/19/18 1111 10/20/18 0020  WBC 22.0*  --  16.2*  LATICACIDVEN 1.7 1.8  --     Microbiology Recent Results (from the past 240 hour(s))  SARS Coronavirus 2 (CEPHEID - Performed in [facility] hospital lab), Hosp Order     Status: None   Collection Time: 10/19/18  2:38 PM  Result Value Ref Range Status   SARS Coronavirus 2 NEGATIVE NEGATIVE Final    Comment: (NOTE) If result is NEGATIVE SARS-CoV-2 target nucleic acids are NOT DETECTED. The SARS-CoV-2 RNA is generally detectable in upper and lower  respiratory specimens during the acute phase of infection. The lowest  concentration of SARS-CoV-2 viral copies this assay can detect is 250  copies / mL. A negative result does not preclude SARS-CoV-2 infection  and should not be used as the sole basis for treatment or other  patient management decisions.  A negative result may occur with  improper specimen collection / handling, submission of specimen other  than nasopharyngeal swab, presence of viral mutation(s) within the  areas targeted by this assay, and inadequate number of viral copies  (<250 copies / mL). A negative result must be combined with clinical  observations, patient history, and epidemiological  information. If result is POSITIVE SARS-CoV-2 target nucleic acids are DETECTED. The SARS-CoV-2 RNA is generally detectable in upper and lower  respiratory specimens dur ing the acute phase of infection.  Positive  results are indicative of active infection with SARS-CoV-2.  Clinical  correlation with patient history and other diagnostic information is  necessary to determine patient infection status.  Positive results do  not rule out bacterial infection or co-infection with other viruses. If result is PRESUMPTIVE POSTIVE SARS-CoV-2 nucleic acids MAY BE PRESENT.   A presumptive positive result was obtained on the submitted specimen  and confirmed on repeat testing.  While 2019 novel coronavirus  (SARS-CoV-2) nucleic acids may be present in the submitted sample  additional confirmatory testing may be necessary for epidemiological  and / or clinical management purposes  to differentiate between  SARS-CoV-2 and other Sarbecovirus currently known to infect humans.  If clinically indicated additional testing with an alternate test  methodology [phone]) is advised.  The SARS-CoV-2 RNA is generally  detectable in upper and lower respiratory sp ecimens during the acute  phase of infection. The expected result is Negative. Fact Sheet for Patients:  BoilerBrush.com.cyhttps://www.fda.gov/media/136312/download Fact Sheet for Healthcare Providers: https://pope.com/https://www.fda.gov/media/136313/download This test is not yet approved or cleared by the [place] FDA and has been authorized for detection and/or diagnosis of SARS-CoV-2 by FDA under an Emergency Use Authorization (EUA).  This EUA will remain in effect (meaning this test can be used) for the duration of the COVID-19 declaration under Section 564(b)(1) of the Act, 21 U.S.C. section 360bbb-3(b)(1), unless the authorization is terminated or revoked sooner. Performed at [facility], [address] [place], [place] [address]   Blood culture  (routine x 2)     Status: None (Preliminary result)   Collection Time: 10/19/18  2:38 PM  Result Value Ref Range Status   Specimen Description   Final    BLOOD RIGHT ANTECUBITAL Performed at [facility], [address] [place], [place] [address]    Special Requests   Final    BOTTLES DRAWN AEROBIC AND ANAEROBIC Blood Culture adequate volume Performed at [facility], [address] [place], [place] [address]    Culture   Final    NO GROWTH < 12 HOURS Performed at [facility], [address] [place], [place] [address]    Report Status PENDING  Incomplete  Blood culture (routine x 2)     Status: None (Preliminary result)   Collection Time: 10/19/18  6:27 PM  Result Value Ref Range Status   Specimen Description   Final    BLOOD LEFT HAND Performed at [facility], [address] [place], [place] [address]    Special Requests   Final    BOTTLES DRAWN AEROBIC ONLY Blood Culture results may not be optimal due to an inadequate volume of blood received in culture bottles Performed at [facility], [address] [place], [place] [address]    Culture   Final    NO GROWTH < 12 HOURS Performed at [facility], [address] [place], [place] [address]    Report Status PENDING  Incomplete  MRSA PCR Screening     Status: None   Collection Time: 10/19/18  6:57 PM  Result Value Ref Range Status   MRSA by PCR NEGATIVE NEGATIVE Final    Comment:        The GeneXpert MRSA Assay (FDA approved for NASAL specimens only), is one component of a comprehensive MRSA colonization surveillance program. It is not intended to diagnose MRSA infection nor to guide or monitor treatment for MRSA infections. Performed at [facility], [address] [place], [place] [address]   Surgical PCR screen     Status: None   Collection Time: 10/19/18  8:24 PM  Result Value Ref Range Status   MRSA, PCR  NEGATIVE NEGATIVE Final   Staphylococcus aureus NEGATIVE NEGATIVE Final    Comment: (NOTE) The Xpert SA Assay (FDA approved for NASAL specimens in patients 60 years of age and older), is one component of a comprehensive surveillance program. It is not intended to diagnose infection nor to guide or monitor treatment. Performed at [facility], [address] [place], [place] [address]     Procedures and diagnostic studies:  Dg Chest 2 View  Result Date: 10/19/2018 CLINICAL DATA:  Intraabdominal infection. EXAM: CHEST - 2 VIEW COMPARISON:  11/05/2004  FINDINGS: Artifact overlies the chest. There is a poor inspiration. There is a pleural effusion on the right. There is dependent pulmonary atelectasis and or infiltrate at the right lung base. No significant bone finding. IMPRESSION: Right effusion with right base atelectasis and or pneumonia Electronically Signed   By: Paulina Fusi M.D.   On: 10/19/2018 16:28   Ct Abdomen Pelvis W Contrast  Result Date: 10/19/2018 CLINICAL DATA:  Acute abdominal pain.  Diarrhea. EXAM: CT ABDOMEN AND PELVIS WITH CONTRAST TECHNIQUE: Multidetector CT imaging of the abdomen and pelvis was performed using the standard protocol following bolus administration of intravenous contrast. CONTRAST:  OMNIPAQUE IOHEXOL 300 MG/ML  SOLN COMPARISON:  Radiographs dated 10/31/2004 FINDINGS: Lower chest: Small right pleural effusion. Focal atelectasis at the right lung base with a single 7 mm lymph node at the inferior aspect of the hilum. Heart appears normal. Hepatobiliary: The gallbladder is distended with edema in the gallbladder wall. No defined stones. No biliary ductal dilatation. Liver parenchyma is normal. Small amount of free fluid around the gallbladder in the right pericolic gutter and surrounding the second portion of the duodenum. Pancreas: Unremarkable. No pancreatic ductal dilatation or surrounding inflammatory changes. Spleen: Normal in size  without focal abnormality. Adrenals/Urinary Tract: Adrenal glands and kidneys are normal. No hydronephrosis. Small diverticulum in the right side of the bladder. The bladder is distended almost to the level of the umbilicus. Stomach/Bowel: There is slight soft tissue stranding around the second portion of the duodenum with slight prominence of the mucosa of the second portion of the duodenum. No discrete ulcer. The appendix is not discretely identified. The cecum lies in right mid abdomen Vascular/Lymphatic: No significant vascular findings are present. No enlarged abdominal or pelvic lymph nodes. Reproductive: Prostate is unremarkable. Other: No free air. 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 There is a small sclerotic lesions in the proximal sacrum which are felt to represent bone islands. At least 1 of these was present on the prior radiograph of 2006 and appears unchanged. IMPRESSION: 1. Findings most consistent with acute cholecystitis with distention of the gallbladder with edema of the gallbladder wall and pericholecystic inflammatory changes. 2. Small right pleural effusion with atelectasis in right lung base. Electronically Signed   By: Francene Boyers M.D.   On: 10/19/2018 11:13   US Abdomen Limited  Result Date: 10/19/2018 CLINICAL DATA:  Possible acute cholecystitis EXAM: ULTRASOUND ABDOMEN LIMITED RIGHT UPPER QUADRANT COMPARISON:  CT scan October 19, 2018 FINDINGS: Gallbladder: Gallbladder wall thickening is identified, measuring up to 9 mm in thickness. Gallbladder sludge is identified with no stones. There is pericholecystic fluid and a positive Murphy's sign. Common bile duct: Diameter: 3.5 mm Liver: No focal lesion identified. Within normal limits in parenchymal echogenicity. Portal vein is patent on color Doppler imaging with normal direction of blood flow towards the liver. IMPRESSION: 1. Gallbladder wall thickening, pericholecystic fluid, gallbladder sludge, and a positive Murphy's  sign are identified. No stones. The constellation of findings suggests acute cholecystitis in the appropriate clinical setting. If the clinical picture is ambiguous, a HIDA scan could further evaluate. Electronically Signed   By: Gerome Sam III M.D   On: 10/19/2018 14:27    Medications:    donepezil  5 mg Oral QHS   enoxaparin (LOVENOX) injection  40 mg Subcutaneous Q24H   loratadine  10 mg Oral Daily   metoprolol succinate  25 mg Oral BID   primidone  250 mg Oral TID   simvastatin  20 mg  Oral Daily   Continuous Infusions:  piperacillin-tazobactam (ZOSYN)  IV 12.5 mL/hr at 10/20/18 0600     LOS: 1 day   Hillery Aldo  [facility] Pager [phone]. If unable to reach me by pager, please call my cell phone at [phone].  *Please refer to [email or web address], password [email or web address] to get updated schedule on who will round on this patient, as hospitalists switch teams weekly. If 7PM-7AM, please contact night-coverage at [email or web address], password [email or web address] for any overnight needs.  10/20/2018, 7:54 AM

## 2018-10-20 NOTE — Anesthesia Procedure Notes (Signed)
Procedure Name: Intubation Date/Time: 10/20/2018 10:08 AM Performed by: Eben Burow, CRNA Pre-anesthesia Checklist: Patient identified, Emergency Drugs available, Suction available, Patient being monitored and Timeout performed Patient Re-evaluated:Patient Re-evaluated prior to induction Oxygen Delivery Method: Circle system utilized Preoxygenation: Pre-oxygenation with 100% oxygen Induction Type: IV induction and Rapid sequence Laryngoscope Size: Mac and 4 Grade View: Grade I Tube type: Oral Tube size: 7.5 mm Number of attempts: 1 Airway Equipment and Method: Stylet Placement Confirmation: ETT inserted through vocal cords under direct vision,  positive ETCO2 and breath sounds checked- equal and bilateral Secured at: 21 cm Tube secured with: Tape Dental Injury: Teeth and Oropharynx as per pre-operative assessment

## 2018-10-20 NOTE — Progress Notes (Signed)
Subjective/Chief Complaint: Pt c/o RUQ pain   Objective: Vital signs in last 24 hours: Temp:  [98.2 F (36.8 C)-99.4 F (37.4 C)] 98.4 F (36.9 C) (06/02 0454) Pulse Rate:  [90-108] 95 (06/02 0454) Resp:  [18-34] 18 (06/02 0454) BP: (111-172)/(61-96) 139/81 (06/02 0454) SpO2:  [95 %-100 %] 99 % (06/02 0454) Weight:  [90.7 kg] 90.7 kg (06/01 0849) Last BM Date: 10/19/18  Intake/Output from previous day: 06/01 0701 - 06/02 0700 In: 1689.6 [P.O.:480; IV Piggyback:1209.6] Out: 1475 [Urine:1475] Intake/Output this shift: No intake/output data recorded.  Constitutional: No acute distress, conversant, appears states age. Eyes: Anicteric sclerae, moist conjunctiva, no lid lag Lungs: Clear to auscultation bilaterally, normal respiratory effort CV: regular rate and rhythm, no murmurs, no peripheral edema, pedal pulses 2+ GI: Soft, no masses or hepatosplenomegaly, tender to palpation RUQ Skin: No rashes, palpation reveals normal turgor Psychiatric: appropriate judgment and insight, oriented to person, place, and time   Lab Results:  Recent Labs    10/19/18 0928 10/20/18 0020  WBC 22.0* 16.2*  HGB 15.3 13.8  HCT 44.9 41.0  PLT 179 175   BMET Recent Labs    10/19/18 1955 10/20/18 0020  NA 126* 128*  K 3.5 3.5  CL 95* 96*  CO2 19* 18*  GLUCOSE 118* 104*  BUN 29* 28*  CREATININE 1.28* 1.37*  CALCIUM 7.5* 7.7*   Studies/Results: Dg Chest 2 View  Result Date: 10/19/2018 CLINICAL DATA:  Intraabdominal infection. EXAM: CHEST - 2 VIEW COMPARISON:  11/05/2004 FINDINGS: Artifact overlies the chest. There is a poor inspiration. There is a pleural effusion on the right. There is dependent pulmonary atelectasis and or infiltrate at the right lung base. No significant bone finding. IMPRESSION: Right effusion with right base atelectasis and or pneumonia Electronically Signed   By: Paulina Fusi M.D.   On: 10/19/2018 16:28   Ct Abdomen Pelvis W Contrast  Result Date:  10/19/2018 CLINICAL DATA:  Acute abdominal pain.  Diarrhea. EXAM: CT ABDOMEN AND PELVIS WITH CONTRAST TECHNIQUE: Multidetector CT imaging of the abdomen and pelvis was performed using the standard protocol following bolus administration of intravenous contrast. CONTRAST:  OMNIPAQUE IOHEXOL 300 MG/ML  SOLN COMPARISON:  Radiographs dated 10/31/2004 FINDINGS: Lower chest: Small right pleural effusion. Focal atelectasis at the right lung base with a single 7 mm lymph node at the inferior aspect of the hilum. Heart appears normal. Hepatobiliary: The gallbladder is distended with edema in the gallbladder wall. No defined stones. No biliary ductal dilatation. Liver parenchyma is normal. Small amount of free fluid around the gallbladder in the right pericolic gutter and surrounding the second portion of the duodenum. Pancreas: Unremarkable. No pancreatic ductal dilatation or surrounding inflammatory changes. Spleen: Normal in size without focal abnormality. Adrenals/Urinary Tract: Adrenal glands and kidneys are normal. No hydronephrosis. Small diverticulum in the right side of the bladder. The bladder is distended almost to the level of the umbilicus. Stomach/Bowel: There is slight soft tissue stranding around the second portion of the duodenum with slight prominence of the mucosa of the second portion of the duodenum. No discrete ulcer. The appendix is not discretely identified. The cecum lies in right mid abdomen Vascular/Lymphatic: No significant vascular findings are present. No enlarged abdominal or pelvic lymph nodes. Reproductive: Prostate is unremarkable. Other: No free air. 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 There is a small sclerotic lesions in the proximal sacrum which are felt to represent bone islands. At least 1 of these was present on the prior  radiograph of 2006 and appears unchanged. IMPRESSION: 1. Findings most consistent with acute cholecystitis with distention of the gallbladder  with edema of the gallbladder wall and pericholecystic inflammatory changes. 2. Small right pleural effusion with atelectasis in right lung base. Electronically Signed   By: Francene BoyersJames  Maxwell M.D.   On: 10/19/2018 11:13   [place] Abdomen Limited  Result Date: 10/19/2018 CLINICAL DATA:  Possible acute cholecystitis EXAM: ULTRASOUND ABDOMEN LIMITED RIGHT UPPER QUADRANT COMPARISON:  CT scan October 19, 2018 FINDINGS: Gallbladder: Gallbladder wall thickening is identified, measuring up to 9 mm in thickness. Gallbladder sludge is identified with no stones. There is pericholecystic fluid and a positive Murphy's sign. Common bile duct: Diameter: 3.5 mm Liver: No focal lesion identified. Within normal limits in parenchymal echogenicity. Portal vein is patent on color Doppler imaging with normal direction of blood flow towards the liver. IMPRESSION: 1. Gallbladder wall thickening, pericholecystic fluid, gallbladder sludge, and a positive Murphy's sign are identified. No stones. The constellation of findings suggests acute cholecystitis in the appropriate clinical setting. If the clinical picture is ambiguous, a HIDA scan could further evaluate. Electronically Signed   By: Gerome Samavid  Williams III M.D   On: 10/19/2018 14:27    Anti-infectives: Anti-infectives (From admission, onward)   Start     Dose/Rate Route Frequency Ordered Stop   10/19/18 2200  piperacillin-tazobactam (ZOSYN) IVPB 3.375 g     3.375 g 12.5 mL/hr over 240 Minutes Intravenous Every 8 hours 10/19/18 1406     10/19/18 1315  piperacillin-tazobactam (ZOSYN) IVPB 3.375 g     3.375 g 100 mL/hr over 30 Minutes Intravenous  Once 10/19/18 1310 10/19/18 1627      Assessment/Plan: Sepsis  Acute cholecystitis Acute kidney injury Hyponatremia Hypokalemia -  K+ 2.9; mag 2.1 AMS - per EMS/group home leader and sister Hx of developmental delay - lives in group home Hx of seizure - on Prednisone/Mysoline - no seizures for 15 years per  sister. Hypertension/homemedicine list Gout Hyperlipidemia   Plan: For OR later today for  Lap chole.  I d/w his sister [name] and all questions were answered.  All risks and benefits were discussed with the patient to generally include: infection, bleeding, possible need for post op ERCP, damage to the bile ducts, and bile leak. Alternatives were offered and described.  All questions were answered and the patient voiced understanding of the procedure and wishes to proceed at this point with a laparoscopic cholecystectomy    LOS: 1 day    Axel Fillerrmando Rose Hegner 10/20/2018

## 2018-10-20 NOTE — Plan of Care (Signed)
  Problem: Health Behavior/Discharge Planning: Goal: Ability to manage health-related needs will improve Outcome: Progressing   Problem: Clinical Measurements: Goal: Ability to maintain clinical measurements within normal limits will improve Outcome: Progressing Goal: Will remain free from infection Outcome: Progressing Goal: Diagnostic test results will improve Outcome: Progressing Goal: Respiratory complications will improve Outcome: Progressing Goal: Cardiovascular complication will be avoided Outcome: Progressing   Problem: Activity: Goal: Risk for activity intolerance will decrease Outcome: Progressing   Problem: Nutrition: Goal: Adequate nutrition will be maintained Outcome: Progressing   Problem: Coping: Goal: Level of anxiety will decrease Outcome: Progressing   Problem: Elimination: Goal: Will not experience complications related to bowel motility Outcome: Progressing   Problem: Pain Managment: Goal: General experience of comfort will improve Outcome: Progressing   Problem: Skin Integrity: Goal: Risk for impaired skin integrity will decrease Outcome: Progressing   Problem: Clinical Measurements: Goal: Ability to maintain clinical measurements within normal limits will improve Outcome: Progressing Goal: Postoperative complications will be avoided or minimized Outcome: Progressing   Problem: Skin Integrity: Goal: Demonstration of wound healing without infection will improve Outcome: Progressing

## 2018-10-20 NOTE — TOC Initial Note (Signed)
Transition of Care [facility]) - Initial/Assessment Note    Patient Details  Name: Gary Neal MRN: [id number] Date of Birth: 11/08/1960  Transition of Care [facility]) CM/SW Contact:    Nelwyn Salisbury, LCSW Phone Number: [phone] coverage for [phone] 10/20/2018, 1:33 PM  Clinical Narrative:   Pt admitted from [facility] yesterday 10/19/18 -   Being treated for sepsis secondary to acute cholecystitis. Had surgery today. Spoke with Fulton Reek, owner/operator of group home. She is aware of pt's status and in close contact with pt's sister who is his closest family support. Pt is his own legal guardian. Fulton Reek asks that at DC she be given notice and faxed DC summary (number below) and will plan for pt's return and transportation home.         Expected Discharge Plan: Group Home Barriers to Discharge: Continued Medical Work up   Patient Goals and CMS Choice Patient states their goals for this hospitalization and ongoing recovery are:: did not participate CMS Medicare.gov Compare Post Acute Care list provided to:: Other (Comment Required)(NA) Choice offered to / list presented to : NA  Expected Discharge Plan and Services Expected Discharge Plan: Group Home In-house Referral: Clinical Social Work     Living arrangements for the past 2 months: Group Home Expected Discharge Date: (unknown)                Prior Living Arrangements/Services Living arrangements for the past 2 months: Group Home Lives with:: Facility Resident Patient language and need for interpreter reviewed:: No Do you feel safe going back to the place where you live?: Yes      Need for Family Participation in Patient Care: Yes (Comment)(sister is contact) Care giver support system in place?: Yes (comment)(group home)   Criminal Activity/Legal Involvement Pertinent to Current Situation/Hospitalization: No - Comment as needed  Activities of Daily Living Home Assistive Devices/Equipment: None ADL Screening  (condition at time of admission) Patient's cognitive ability adequate to safely complete daily activities?: No Is the patient deaf or have difficulty hearing?: No Does the patient have difficulty seeing, even when wearing glasses/contacts?: No Does the patient have difficulty concentrating, remembering, or making decisions?: Yes Patient able to express need for assistance with ADLs?: No Does the patient have difficulty dressing or bathing?: Yes Independently performs ADLs?: No Communication: Independent Dressing (OT): Needs assistance Is this a change from baseline?: Change from baseline, expected to last >3 days Grooming: Needs assistance Is this a change from baseline?: Change from baseline, expected to last >3 days Feeding: Needs assistance Is this a change from baseline?: Change from baseline, expected to last >3 days Bathing: Needs assistance Is this a change from baseline?: Change from baseline, expected to last >3 days Toileting: Needs assistance Is this a change from baseline?: Change from baseline, expected to last >3days In/Out Bed: Needs assistance Is this a change from baseline?: Change from baseline, expected to last >3 days Walks in Home: Needs assistance Is this a change from baseline?: Change from baseline, expected to last >3 days Does the patient have difficulty walking or climbing stairs?: Yes Weakness of Legs: Both Weakness of Arms/Hands: Both  Permission Sought/Granted Permission sought to share information with : Facility Medical sales representative, Family Supports Permission granted to share information with : Yes, Verbal Permission Granted  Share Information with NAME: [phone] sister Zannie Cove  Permission granted to share info w AGENCY: [facility] [place], landline [phone], cell [phone], fax [phone]        Emotional Assessment  Appearance:: Appears older than stated age     Orientation: : Oriented to Self, Oriented to Place    Psych Involvement: No (comment)  Admission diagnosis:  Acute cholecystitis [K81.0] Tachypnea [R06.82] Cholecystitis [K81.9] Rales [R09.89] Patient Active Problem List   Diagnosis Date Noted  . Sepsis (HCC) secondary to acute cholecystitis 10/20/2018  . Pleural effusion on right 10/20/2018  . Hyponatremia 10/20/2018  . AKI (acute kidney injury) (HCC) 10/20/2018  . Hyperlipidemia 10/20/2018  . Essential hypertension 10/20/2018  . Mental disability 10/20/2018  . Acute cholecystitis 10/19/2018   PCP:  [facility]:   [id number] [place] - [address] [facility] [address] [place] [id number] Phone: [phone] Fax: [phone]     Social Determinants of Health (SDOH) Interventions    Readmission Risk Interventions No flowsheet data found.

## 2018-10-20 NOTE — Discharge Instructions (Signed)
[facility] ° °Please arrive at least 30 min before your appointment to complete your check in paperwork.  If you are unable to arrive 30 min prior to your appointment time we may have to cancel or reschedule you. °LAPAROSCOPIC SURGERY: POST OP INSTRUCTIONS °Always review your discharge instruction sheet given to you by the facility where your surgery was performed. °IF YOU HAVE DISABILITY OR FAMILY LEAVE FORMS, YOU MUST BRING THEM TO THE OFFICE FOR PROCESSING.   °DO NOT GIVE THEM TO YOUR DOCTOR. ° °PAIN CONTROL ° °1. First take acetaminophen (Tylenol) AND/or ibuprofen (Advil) to control your pain after surgery.  Follow directions on package.  Taking acetaminophen (Tylenol) and/or ibuprofen (Advil) regularly after surgery will help to control your pain and lower the amount of prescription pain medication you may need.  You should not take more than 4,000 mg (4 grams) of acetaminophen (Tylenol) in 24 hours.  You should not take ibuprofen (Advil), aleve, motrin, naprosyn or other NSAIDS if you have a history of stomach ulcers or chronic kidney disease.  °2. A prescription for pain medication may be given to you upon discharge.  Take your pain medication as prescribed, if you still have uncontrolled pain after taking acetaminophen (Tylenol) or ibuprofen (Advil). °3. Use ice packs to help control pain. °4. If you need a refill on your pain medication, please contact your pharmacy.  They will contact our office to request authorization. Prescriptions will not be filled after 5pm or on week-ends. ° °HOME MEDICATIONS °5. Take your usually prescribed medications unless otherwise directed. ° °DIET °6. You should follow a light diet the first few days after arrival home.  Be sure to include lots of fluids daily. Avoid fatty, fried foods.  ° °CONSTIPATION °7. It is common to experience some constipation after surgery and if you are taking pain medication.  Increasing fluid intake and taking a stool  softener (such as Colace) will usually help or prevent this problem from occurring.  A mild laxative (Milk of Magnesia or Miralax) should be taken according to package instructions if there are no bowel movements after 48 hours. ° °WOUND/INCISION CARE °8. Most patients will experience some swelling and bruising in the area of the incisions.  Ice packs will help.  Swelling and bruising can take several days to resolve.  °9. Unless discharge instructions indicate otherwise, follow guidelines below  °a. STERI-STRIPS - you may remove your outer bandages 48 hours after surgery, and you may shower at that time.  You have steri-strips (small skin tapes) in place directly over the incision.  These strips should be left on the skin for 7-10 days.   °b. DERMABOND/SKIN GLUE - you may shower in 24 hours.  The glue will flake off over the next 2-3 weeks. °10. Any sutures or staples will be removed at the office during your follow-up visit. ° °ACTIVITIES °11. You may resume regular (light) daily activities beginning the next day--such as daily self-care, walking, climbing stairs--gradually increasing activities as tolerated.  You may have sexual intercourse when it is comfortable.  Refrain from any heavy lifting or straining until approved by your doctor. °a. You may drive when you are no longer taking prescription pain medication, you can comfortably wear a seatbelt, and you can safely maneuver your car and apply brakes. ° °FOLLOW-UP °12. You should see your doctor in the office for a follow-up appointment approximately 2-3 weeks after your surgery.  You should have been given your post-op/follow-up appointment when   your surgery was scheduled.  If you did not receive a post-op/follow-up appointment, make sure that you call for this appointment within a day or two after you arrive home to insure a convenient appointment time. ° °OTHER INSTRUCTIONS ° °WHEN TO CALL YOUR DOCTOR: °1. Fever over 101.0 °2. Inability to  urinate °3. Continued bleeding from incision. °4. Increased pain, redness, or drainage from the incision. °5. Increasing abdominal pain ° °The clinic staff is available to answer your questions during regular business hours.  Please don’t hesitate to call and ask to speak to one of the nurses for clinical concerns.  If you have a medical emergency, go to the nearest emergency room or call 911.  A surgeon from [facility] is always on call at the hospital. °[address], [address], [place], [place]  [address] ? [address] [address], [place], [place]   [address] °[phone] ? [phone] ? FAX [phone] ° ° ° °

## 2018-10-20 NOTE — Anesthesia Postprocedure Evaluation (Signed)
Anesthesia Post Note  Patient: Gary Neal  Procedure(s) Performed: LAPAROSCOPIC CHOLECYSTECTOMY (N/A )     Patient location during evaluation: PACU Anesthesia Type: General Level of consciousness: awake and alert Pain management: pain level controlled Vital Signs Assessment: post-procedure vital signs reviewed and stable Respiratory status: spontaneous breathing, nonlabored ventilation, respiratory function stable and patient connected to nasal cannula oxygen Cardiovascular status: blood pressure returned to baseline and stable Postop Assessment: no apparent nausea or vomiting Anesthetic complications: no    Last Vitals:  Vitals:   10/20/18 1155 10/20/18 1207  BP: 140/80 (!) 156/90  Pulse: (!) 105 (!) 108  Resp: 20 (!) 22  Temp: 36.8 C 36.8 C  SpO2: 95% 97%    Last Pain:  Vitals:   10/20/18 1253  TempSrc:   PainSc: Asleep                 Beryle Lathe

## 2018-10-20 NOTE — Anesthesia Preprocedure Evaluation (Addendum)
Anesthesia Evaluation  Patient identified by MRN, date of birth, ID band Patient awake    Reviewed: Allergy & Precautions, NPO status , Patient's Chart, lab work & pertinent test results  History of Anesthesia Complications Negative for: history of anesthetic complications  Airway Mallampati: II  TM Distance: >3 FB Neck ROM: Full    Dental  (+) Dental Advisory Given, Poor Dentition, Missing   Pulmonary neg pulmonary ROS,    breath sounds clear to auscultation       Cardiovascular hypertension, Pt. on medications  Rhythm:Regular Rate:Normal   EKG - ST 103bpm, inc RBBB   Neuro/Psych Seizures -, Well Controlled,   Developmentally disabled   negative psych ROS   GI/Hepatic Neg liver ROS,  Cholecystitis    Endo/Other  negative endocrine ROS Hyponatremia   Renal/GU Renal InsufficiencyRenal disease     Musculoskeletal negative musculoskeletal ROS (+)   Abdominal   Peds  (+) mental retardation Hematology negative hematology ROS (+)   Anesthesia Other Findings   Reproductive/Obstetrics                            Anesthesia Physical Anesthesia Plan  ASA: II  Anesthesia Plan: General   Post-op Pain Management:    Induction: Intravenous and Rapid sequence  PONV Risk Score and Plan: 4 or greater and Treatment may vary due to age or medical condition, Ondansetron and Dexamethasone  Airway Management Planned: Oral ETT  Additional Equipment: None  Intra-op Plan:   Post-operative Plan: Extubation in OR  Informed Consent: I have reviewed the patients History and Physical, chart, labs and discussed the procedure including the risks, benefits and alternatives for the proposed anesthesia with the patient or authorized representative who has indicated his/her understanding and acceptance.     Dental advisory given  Plan Discussed with: CRNA and Anesthesiologist  Anesthesia Plan  Comments:        Anesthesia Quick Evaluation

## 2018-10-20 NOTE — Transfer of Care (Signed)
Immediate Anesthesia Transfer of Care Note  Patient: Gary Neal  Procedure(s) Performed: LAPAROSCOPIC CHOLECYSTECTOMY (N/A )  Patient Location: PACU  Anesthesia Type:General  Level of Consciousness: awake and confused  Airway & Oxygen Therapy: Patient Spontanous Breathing and Patient connected to face mask oxygen  Post-op Assessment: Report given to RN and Post -op Vital signs reviewed and stable  Post vital signs: Reviewed and stable  Last Vitals:  Vitals Value Taken Time  BP 163/86 10/20/2018 11:30 AM  Temp    Pulse 109 10/20/2018 11:32 AM  Resp 14 10/20/2018 11:32 AM  SpO2 99 % 10/20/2018 11:32 AM  Vitals shown include unvalidated device data.  Last Pain:  Vitals:   10/20/18 0454  TempSrc: Oral  PainSc:          Complications: No apparent anesthesia complications

## 2018-10-20 NOTE — Op Note (Signed)
10/20/2018  11:13 AM  PATIENT:  Gary Neal  58 y.o. male  PRE-OPERATIVE DIAGNOSIS:  Acute cholecystitis  POST-OPERATIVE DIAGNOSIS:  Acute, gangrenous cholecystitis  PROCEDURE:  Procedure(s): LAPAROSCOPIC CHOLECYSTECTOMY (N/A)  SURGEON:  Surgeon(s) and Role:    Axel Filler, MD - Primary  ANESTHESIA:   local and general  EBL:  30 mL   BLOOD ADMINISTERED:none  DRAINS: none   LOCAL MEDICATIONS USED:  BUPIVICAINE   SPECIMEN:  Source of Specimen:  gallbladder  DISPOSITION OF SPECIMEN:  PATHOLOGY  COUNTS:  YES  TOURNIQUET:  * No tourniquets in log *  DICTATION: .Dragon Dictation The patient was taken to the operating and placed in the supine position with bilateral SCDs in place.  The patient was prepped and draped in the usual sterile fashion. A time out was called and all facts were verified. A pneumoperitoneum was obtained via A Veress needle technique to a pressure of 77mm of mercury.  A 54mm trochar was then placed in the right upper quadrant under visualization, and there were no injuries to any abdominal organs. A 11 mm port was then placed in the umbilical region after infiltrating with local anesthesia under direct visualization. A second and third epigastric port and right lower quadrant port placement under direct visualization, respectively.   The gallbladder was identified and was seen to be gangrenous.  It was very inflamed and difficult to grasp.  It evacuated with suction.  It was then grasped and retracted, the peritoneum was then sharply dissected from the gallbladder and this dissection was carried down to Calot's triangle. The gallbladder was identified and stripped away circumferentially and seen going into the gallbladder 360, the critical angle was obtained.  2 clips were placed proximally one distally and the cystic duct transected. The cystic artery was identified and 2 clips placed proximally and one distally and transected.  We then proceeded to  remove the gallbladder off the hepatic fossa with Bovie cautery. A retrieval bag was then placed in the abdomen and gallbladder placed in the bag. The hepatic fossa was then reexamined and hemostasis was achieved with Bovie cautery and was excellent at the end of the case.   The subhepatic fossa and perihepatic fossa was then irrigated until the effluent was clear.  The gallbladder and bag were removed from the abdominal cavity. The 11 mm trocar fascia was reapproximated with the Endo Close #1 Vicryl x2.  The pneumoperitoneum was evacuated and all trochars removed under direct visulalization.  The skin was then closed with 4-0 Monocryl and the skin dressed with Dermabond.    The patient was awaken from general anesthesia and taken to the recovery room in stable condition.   PLAN OF CARE: Admit for overnight observation  PATIENT DISPOSITION:  PACU - hemodynamically stable.   Delay start of Pharmacological VTE agent (>24hrs) due to surgical blood loss or risk of bleeding: yes

## 2018-10-21 ENCOUNTER — Encounter (HOSPITAL_COMMUNITY): Payer: Self-pay | Admitting: General Surgery

## 2018-10-21 MED ORDER — HYDROMORPHONE HCL 1 MG/ML IJ SOLN: 0.5000 mg | INTRAMUSCULAR | Status: DC | PRN

## 2018-10-21 MED ORDER — SIMETHICONE 80 MG PO CHEW: 80.0000 mg | CHEWABLE_TABLET | Freq: Four times a day (QID) | ORAL | Status: DC | PRN

## 2018-10-21 MED ORDER — TRAMADOL HCL 50 MG PO TABS: 50.0000 mg | ORAL_TABLET | Freq: Four times a day (QID) | ORAL | Status: DC | PRN

## 2018-10-21 NOTE — Progress Notes (Signed)
[facility] Progress Note  1 Day Post-Op  Subjective: CC-  Sitting up in bed. Just finished eating breakfast. States that his abdomen is sore but overall pain well controlled. Tolerating diet. Denies n/v. Passing some flatus.  Per RN patient did ambulate some in his room last night with walker.  Objective: Vital signs in last 24 hours: Temp:  [97.6 F (36.4 C)-98.3 F (36.8 C)] 98.2 F (36.8 C) (06/03 0510) Pulse Rate:  [85-122] 90 (06/03 0510) Resp:  [14-22] 20 (06/03 0510) BP: (112-163)/(66-98) 112/66 (06/03 0510) SpO2:  [94 %-99 %] 97 % (06/02 2105) Last BM Date: 10/20/18  Intake/Output from previous day: 06/02 0701 - 06/03 0700 In: 2738.2 [P.O.:240; I.V.:2334.6; IV Piggyback:163.7] Out: 1405 [Urine:1375; Blood:30] Intake/Output this shift: No intake/output data recorded.  PE: Gen:  Alert, NAD HEENT: EOM's intact, pupils equal and round Pulm:  effort normal Abd: Soft, mild distension, appropriately tender, few BS heard, lap incision cdi with some ecchymosis but no erythema or drainage Skin: warm and dry  Lab Results:  Recent Labs    10/19/18 0928 10/20/18 0020  WBC 22.0* 16.2*  HGB 15.3 13.8  HCT 44.9 41.0  PLT 179 175   BMET Recent Labs    10/20/18 0020 10/20/18 0836  NA 128* 134*  K 3.5 3.9  CL 96* 100  CO2 18* 21*  GLUCOSE 104* 91  BUN 28* 30*  CREATININE 1.37* 1.30*  CALCIUM 7.7* 7.9*   PT/INR No results for input(s): LABPROT, INR in the last 72 hours. CMP     Component Value Date/Time   NA 134 (L) 10/20/2018 0836   K 3.9 10/20/2018 0836   CL 100 10/20/2018 0836   CO2 21 (L) 10/20/2018 0836   GLUCOSE 91 10/20/2018 0836   BUN 30 (H) 10/20/2018 0836   CREATININE 1.30 (H) 10/20/2018 0836   CALCIUM 7.9 (L) 10/20/2018 0836   PROT 6.6 10/20/2018 0020   ALBUMIN 3.0 (L) 10/20/2018 0020   AST 48 (H) 10/20/2018 0020   ALT 35 10/20/2018 0020   ALKPHOS 63 10/20/2018 0020   BILITOT 0.9 10/20/2018 0020   GFRNONAA 60 (L) 10/20/2018  0836   GFRAA >60 10/20/2018 0836   Lipase     Component Value Date/Time   LIPASE 24 10/19/2018 0928       Studies/Results: Dg Chest 2 View  Result Date: 10/19/2018 CLINICAL DATA:  Intraabdominal infection. EXAM: CHEST - 2 VIEW COMPARISON:  11/05/2004 FINDINGS: Artifact overlies the chest. There is a poor inspiration. There is a pleural effusion on the right. There is dependent pulmonary atelectasis and or infiltrate at the right lung base. No significant bone finding. IMPRESSION: Right effusion with right base atelectasis and or pneumonia Electronically Signed   By: Paulina FusiMark  Shogry M.D.   On: 10/19/2018 16:28   Ct Abdomen Pelvis W Contrast  Result Date: 10/19/2018 CLINICAL DATA:  Acute abdominal pain.  Diarrhea. EXAM: CT ABDOMEN AND PELVIS WITH CONTRAST TECHNIQUE: Multidetector CT imaging of the abdomen and pelvis was performed using the standard protocol following bolus administration of intravenous contrast. CONTRAST:  100mL OMNIPAQUE IOHEXOL 300 MG/ML  SOLN COMPARISON:  Radiographs dated 10/31/2004 FINDINGS: Lower chest: Small right pleural effusion. Focal atelectasis at the right lung base with a single 7 mm lymph node at the inferior aspect of the hilum. Heart appears normal. Hepatobiliary: The gallbladder is distended with edema in the gallbladder wall. No defined stones. No biliary ductal dilatation. Liver parenchyma is normal. Small amount of free fluid around the gallbladder  in the right pericolic gutter and surrounding the second portion of the duodenum. Pancreas: Unremarkable. No pancreatic ductal dilatation or surrounding inflammatory changes. Spleen: Normal in size without focal abnormality. Adrenals/Urinary Tract: Adrenal glands and kidneys are normal. No hydronephrosis. Small diverticulum in the right side of the bladder. The bladder is distended almost to the level of the umbilicus. Stomach/Bowel: There is slight soft tissue stranding around the second portion of the duodenum with  slight prominence of the mucosa of the second portion of the duodenum. No discrete ulcer. The appendix is not discretely identified. The cecum lies in right mid abdomen Vascular/Lymphatic: No significant vascular findings are present. No enlarged abdominal or pelvic lymph nodes. Reproductive: Prostate is unremarkable. Other: No free air. 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 There is a small sclerotic lesions in the proximal sacrum which are felt to represent bone islands. At least 1 of these was present on the prior radiograph of 2006 and appears unchanged. IMPRESSION: 1. Findings most consistent with acute cholecystitis with distention of the gallbladder with edema of the gallbladder wall and pericholecystic inflammatory changes. 2. Small right pleural effusion with atelectasis in right lung base. Electronically Signed   By: Francene Boyers M.D.   On: 10/19/2018 11:13   US Abdomen Limited  Result Date: 10/19/2018 CLINICAL DATA:  Possible acute cholecystitis EXAM: ULTRASOUND ABDOMEN LIMITED RIGHT UPPER QUADRANT COMPARISON:  CT scan October 19, 2018 FINDINGS: Gallbladder: Gallbladder wall thickening is identified, measuring up to 9 mm in thickness. Gallbladder sludge is identified with no stones. There is pericholecystic fluid and a positive Murphy's sign. Common bile duct: Diameter: 3.5 mm Liver: No focal lesion identified. Within normal limits in parenchymal echogenicity. Portal vein is patent on color Doppler imaging with normal direction of blood flow towards the liver. IMPRESSION: 1. Gallbladder wall thickening, pericholecystic fluid, gallbladder sludge, and a positive Murphy's sign are identified. No stones. The constellation of findings suggests acute cholecystitis in the appropriate clinical setting. If the clinical picture is ambiguous, a HIDA scan could further evaluate. Electronically Signed   By: Gerome Sam III M.D   On: 10/19/2018 14:27    Anti-infectives: Anti-infectives (From  admission, onward)   Start     Dose/Rate Route Frequency Ordered Stop   10/20/18 1300  piperacillin-tazobactam (ZOSYN) IVPB 3.375 g     3.375 g 12.5 mL/hr over 240 Minutes Intravenous Every 8 hours 10/20/18 1211 10/21/18 1359   10/19/18 2200  piperacillin-tazobactam (ZOSYN) IVPB 3.375 g  Status:  Discontinued     3.375 g 12.5 mL/hr over 240 Minutes Intravenous Every 8 hours 10/19/18 1406 10/20/18 1211   10/19/18 1315  piperacillin-tazobactam (ZOSYN) IVPB 3.375 g     3.375 g 100 mL/hr over 30 Minutes Intravenous  Once 10/19/18 1310 10/19/18 1627       Assessment/Plan H/o seizures HTN HLD Mental disability   Acute, gangrenous cholecystitis S/p laparoscopic cholecystectomy 6/2 Dr. Derrell Lolling - POD 1 - Patient is tolerating a diet and passing flatus. Needs to mobilize. If this goes well and his pain is under control he is stable for discharge later today from surgical standpoint. Discharge instructions and f/u info on AVS. He does not need any more antibiotics from our standpoint.  ID - zosyn 6/1>> FEN - reg diet VTE - SCDs, lovenox Foley - none Follow up - [facility] clinic   LOS: 2 days    Franne Forts , [facility] 10/21/2018, 9:00 AM Pager: [phone] Mon-Thurs 7:00 am-4:30 pm Fri 7:00 am -  11:30 AM Sat-Sun 7:00 am-11:30 am

## 2018-10-21 NOTE — TOC Transition Note (Signed)
Transition of Care [facility]) - CM/SW Discharge Note   Patient Details  Name: Gary Neal MRN: [id number] Date of Birth: 1960/12/21  Transition of [facility] CM/SW Contact:  Althea Charon, LCSW Phone Number: 10/21/2018, 2:28 PM   Clinical Narrative:  CSW spoke with patient care giver Lithay via phone. Fulton Reek stated she will pick patient up at 4 pm and take him back to group home. Lithay requested that RN please contact her so RN can go over AVS with her prior to patients discharge. CSW made RN aware of Lithay request       Final next level of care: Group Home Barriers to Discharge: No Barriers Identified   Patient Goals and CMS Choice Patient states their goals for this hospitalization and ongoing recovery are:: did not participate CMS Medicare.gov Compare Post Acute Care list provided to:: Other (Comment Required)(NA) Choice offered to / list presented to : NA  Discharge Placement                Patient to be transferred to facility by: pt group home owner will pick patient up a 4pm Name of family member notified: spoke with [place] Patient and family notified of of transfer: 10/21/18  Discharge Plan and Services In-house Referral: Clinical Social Work                                   Social Determinants of Health (SDOH) Interventions     Readmission Risk Interventions No flowsheet data found.

## 2018-10-21 NOTE — Discharge Summary (Signed)
Physician Discharge Summary  LAURIN MORGENSTERN AVW:[id number] DOB: 11-Mar-1961 DOA: 10/19/2018  PCP: [facility]  Admit date: 10/19/2018 Discharge date: 10/21/2018  Admitted From: Home Disposition:  Home  Discharge Condition:Stable CODE STATUS:FULL Diet recommendation: Heart Healthy  Brief/Interim Summary: Gary Neal is an 58 y.o. male with a PMH of seizures, hyperlipidemia, hypertension, and mental disability who was admitted 10/19/2018 for evaluation of right upper quadrant pain associated with one episode of watery stools.  Upon initial evaluation in the ED, vital signs were stable and he was noted to have a sodium of 122, potassium 2.9, creatinine 1.69T 68, WBC 22 with CT of the abdomen and pelvis showing cholecystitis and a right pleural effusion.  He was given 1 L of normal saline and placed on empiric Zosyn.  General surgery consulted and he underwent laparoscopic cholecystectomy. Patient is currently hemodynamically stable, tolerating diet.  General surgery cleared him for discharge today.  Following problems were addressed during his hospitalization:   Sepsis (HCC) secondary to acute cholecystitis Criteria on admission with significant leukocytosis, low-grade fever, tachypnea and tachycardia.  CT and abdominal ultrasound suggestive of acute cholecystitis, personally reviewed.  Source likely acute cholecystitis  Surgery consulted with and he underwent cholecystectomy.  Currently hemodynamically stable.  Blood cultures no growth till date.  Discontinued antibiotics.   Pleural effusion on right associated with tachypnea Images personally reviewed.  Right basilar effusion with atelectasis apparent.  He is  maintaining oxygen saturations well.    Hyponatremia Likely from Dyazide in the setting of dehydration.  Sodium improved with  rehydration and holding thiazide diuretics.    AKI vs CKD Likely from dehydration in the setting of Dyazide and lisinopril use.  Creatinine in the  range of 1.6 on presentation which improved with hydration.Baseline kidney function not available.Check BMP in a week after following with PCP.Diuretics and lisinopril will be held because he presented with acute kidney injury and severe hyponatremia.    Hyperlipidemia Continue Zocor.    Essential hypertension Blood pressure currently controlled.  Continue metoprolol    Mental disability Continue Aricept.   Discharge Diagnoses:  Principal Problem:   Sepsis (HCC) secondary to acute cholecystitis Active Problems:   Acute cholecystitis   Pleural effusion on right   Hyponatremia   AKI (acute kidney injury) (HCC)   Hyperlipidemia   Essential hypertension   Mental disability    Discharge Instructions  Discharge Instructions    Diet - low sodium heart healthy   Complete by:  As directed    Discharge instructions   Complete by:  As directed    1)Please follow up with general surgery as an outpatient ion 11/03/18. 2)Follow up with your PCP in a week. Do a CBC and BMP tests during the follow up. 3)Take your home medicines as instructed.   Increase activity slowly   Complete by:  As directed      Allergies as of 10/21/2018   No Known Allergies     Medication List    STOP taking these medications   lisinopril 10 MG tablet Commonly known as:  ZESTRIL   predniSONE 50 MG tablet Commonly known as:  DELTASONE   triamterene-hydrochlorothiazide 37.5-25 MG capsule Commonly known as:  DYAZIDE     TAKE these medications   acetaminophen 325 MG tablet Commonly known as:  TYLENOL Take 650 mg by mouth every 6 (six) hours as needed for mild pain or headache.   albuterol 108 (90 Base) MCG/ACT inhaler Commonly known as:  VENTOLIN HFA Inhale 2  puffs into the lungs every 4 (four) hours as needed for wheezing or shortness of breath.   allopurinol 100 MG tablet Commonly known as:  ZYLOPRIM Take 100 mg by mouth daily.   cetirizine 10 MG tablet Commonly known as:  ZYRTEC Take 10  mg by mouth daily.   clotrimazole-betamethasone cream Commonly known as:  LOTRISONE Apply 1 application topically 2 (two) times daily.   colchicine 0.6 MG tablet Take 0.6 mg by mouth daily.   donepezil 5 MG tablet Commonly known as:  ARICEPT Take 5 mg by mouth at bedtime.   ergocalciferol 1.25 MG (50000 UT) capsule Commonly known as:  VITAMIN D2 Take 50,000 Units by mouth once a week.   metoprolol succinate 25 MG 24 hr tablet Commonly known as:  TOPROL-XL Take 25 mg by mouth 2 (two) times a day.   ondansetron 4 MG tablet Commonly known as:  ZOFRAN Take 2 tablets (8 mg total) by mouth every 4 (four) hours as needed for nausea or vomiting.   primidone 250 MG tablet Commonly known as:  MYSOLINE Take 250 mg by mouth 3 (three) times daily.   simvastatin 20 MG tablet Commonly known as:  ZOCOR Take 20 mg by mouth daily.   Stool Softener 100 MG capsule Generic drug:  Docusate Sodium Take 100 mg by mouth 2 (two) times daily.   triamcinolone 55 MCG/ACT Aero nasal inhaler Commonly known as:  NASACORT Place 2 sprays into the nose daily.      Follow-up Information    [facility], [name]. Go on 11/03/2018.   Specialty:  General Surgery Why:  Your appointment is 6/16 at 10am. Please arrive 30 minutes prior to your appointment to check in and fill out paperwork. Bring photo ID and insurance information. Contact information: [address] [place] [address] [phone]       [facility]. Schedule an appointment as soon as possible for a visit in 1 week(s).   Contact information: [address] [place] [address] [phone]          No Known Allergies  Consultations:  General surgery   Procedures/Studies: Dg Chest 2 View  Result Date: 10/19/2018 CLINICAL DATA:  Intraabdominal infection. EXAM: CHEST - 2 VIEW COMPARISON:  11/05/2004 FINDINGS: Artifact overlies the chest. There is a poor inspiration. There  is a pleural effusion on the right. There is dependent pulmonary atelectasis and or infiltrate at the right lung base. No significant bone finding. IMPRESSION: Right effusion with right base atelectasis and or pneumonia Electronically Signed   By: Paulina Fusi M.D.   On: 10/19/2018 16:28   Ct Abdomen Pelvis W Contrast  Result Date: 10/19/2018 CLINICAL DATA:  Acute abdominal pain.  Diarrhea. EXAM: CT ABDOMEN AND PELVIS WITH CONTRAST TECHNIQUE: Multidetector CT imaging of the abdomen and pelvis was performed using the standard protocol following bolus administration of intravenous contrast. CONTRAST:  OMNIPAQUE IOHEXOL 300 MG/ML  SOLN COMPARISON:  Radiographs dated 10/31/2004 FINDINGS: Lower chest: Small right pleural effusion. Focal atelectasis at the right lung base with a single 7 mm lymph node at the inferior aspect of the hilum. Heart appears normal. Hepatobiliary: The gallbladder is distended with edema in the gallbladder wall. No defined stones. No biliary ductal dilatation. Liver parenchyma is normal. Small amount of free fluid around the gallbladder in the right pericolic gutter and surrounding the second portion of the duodenum. Pancreas: Unremarkable. No pancreatic ductal dilatation or surrounding inflammatory changes. Spleen: Normal in size without  focal abnormality. Adrenals/Urinary Tract: Adrenal glands and kidneys are normal. No hydronephrosis. Small diverticulum in the right side of the bladder. The bladder is distended almost to the level of the umbilicus. Stomach/Bowel: There is slight soft tissue stranding around the second portion of the duodenum with slight prominence of the mucosa of the second portion of the duodenum. No discrete ulcer. The appendix is not discretely identified. The cecum lies in right mid abdomen Vascular/Lymphatic: No significant vascular findings are present. No enlarged abdominal or pelvic lymph nodes. Reproductive: Prostate is unremarkable. Other: No free air.  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 There is a small sclerotic lesions in the proximal sacrum which are felt to represent bone islands. At least 1 of these was present on the prior radiograph of 2006 and appears unchanged. IMPRESSION: 1. Findings most consistent with acute cholecystitis with distention of the gallbladder with edema of the gallbladder wall and pericholecystic inflammatory changes. 2. Small right pleural effusion with atelectasis in right lung base. Electronically Signed   By: Francene Boyers M.D.   On: 10/19/2018 11:13   US Abdomen Limited  Result Date: 10/19/2018 CLINICAL DATA:  Possible acute cholecystitis EXAM: ULTRASOUND ABDOMEN LIMITED RIGHT UPPER QUADRANT COMPARISON:  CT scan October 19, 2018 FINDINGS: Gallbladder: Gallbladder wall thickening is identified, measuring up to 9 mm in thickness. Gallbladder sludge is identified with no stones. There is pericholecystic fluid and a positive Murphy's sign. Common bile duct: Diameter: 3.5 mm Liver: No focal lesion identified. Within normal limits in parenchymal echogenicity. Portal vein is patent on color Doppler imaging with normal direction of blood flow towards the liver. IMPRESSION: 1. Gallbladder wall thickening, pericholecystic fluid, gallbladder sludge, and a positive Murphy's sign are identified. No stones. The constellation of findings suggests acute cholecystitis in the appropriate clinical setting. If the clinical picture is ambiguous, a HIDA scan could further evaluate. Electronically Signed   By: Gerome Sam III M.D   On: 10/19/2018 14:27      Subjective: Patient seen and examined at bedside this morning.  Feels comfortable.  Hemodynamically stable.  Has not had a bowel movement. Tolerated the diet this morning  Discharge Exam: Vitals:   10/21/18 0510 10/21/18 1038  BP: 112/66 124/70  Pulse: 90 87  Resp: 20   Temp: 98.2 F (36.8 C)   SpO2:     Vitals:   10/20/18 1631 10/20/18 2105 10/21/18 0510  10/21/18 1038  BP: 132/87 128/87 112/66 124/70  Pulse: 86 85 90 87  Resp: Temp: 97.6 F (36.4 C) 97.7 F (36.5 C) 98.2 F (36.8 C)   TempSrc: Oral Oral Oral   SpO2: 98% 97%    Weight:      Height:        General: Pt is alert, awake, not in acute distress Cardiovascular: RRR, S1/S2 +, no rubs, no gallops Respiratory: CTA bilaterally, no wheezing, no rhonchi Abdominal: Soft, NT, ND, bowel sounds +,clean surgical scars Extremities: no edema, no cyanosis    The results of significant diagnostics from this hospitalization (including imaging, microbiology, ancillary and laboratory) are listed below for reference.     Microbiology: Recent Results (from the past 240 hour(s))  SARS Coronavirus 2 (CEPHEID - Performed in [facility] hospital lab), Hosp Order     Status: None   Collection Time: 10/19/18  2:38 PM  Result Value Ref Range Status   SARS Coronavirus 2 NEGATIVE NEGATIVE Final    Comment: (NOTE) If result is NEGATIVE SARS-CoV-2 target  nucleic acids are NOT DETECTED. The SARS-CoV-2 RNA is generally detectable in upper and lower  respiratory specimens during the acute phase of infection. The lowest  concentration of SARS-CoV-2 viral copies this assay can detect is 250  copies / mL. A negative result does not preclude SARS-CoV-2 infection  and should not be used as the sole basis for treatment or other  patient management decisions.  A negative result may occur with  improper specimen collection / handling, submission of specimen other  than nasopharyngeal swab, presence of viral mutation(s) within the  areas targeted by this assay, and inadequate number of viral copies  (<250 copies / mL). A negative result must be combined with clinical  observations, patient history, and epidemiological information. If result is POSITIVE SARS-CoV-2 target nucleic acids are DETECTED. The SARS-CoV-2 RNA is generally detectable in upper and lower  respiratory specimens dur ing  the acute phase of infection.  Positive  results are indicative of active infection with SARS-CoV-2.  Clinical  correlation with patient history and other diagnostic information is  necessary to determine patient infection status.  Positive results do  not rule out bacterial infection or co-infection with other viruses. If result is PRESUMPTIVE POSTIVE SARS-CoV-2 nucleic acids MAY BE PRESENT.   A presumptive positive result was obtained on the submitted specimen  and confirmed on repeat testing.  While 2019 novel coronavirus  (SARS-CoV-2) nucleic acids may be present in the submitted sample  additional confirmatory testing may be necessary for epidemiological  and / or clinical management purposes  to differentiate between  SARS-CoV-2 and other Sarbecovirus currently known to infect humans.  If clinically indicated additional testing with an alternate test  methodology [phone] is advised. The SARS-CoV-2 RNA is generally  detectable in upper and lower respiratory sp ecimens during the acute  phase of infection. The expected result is Negative. Fact Sheet for Patients:  BoilerBrush.com.cyhttps://www.fda.gov/media/136312/download Fact Sheet for Healthcare Providers: https://pope.com/https://www.fda.gov/media/136313/download This test is not yet approved or cleared by the [place] FDA and has been authorized for detection and/or diagnosis of SARS-CoV-2 by FDA under an Emergency Use Authorization (EUA).  This EUA will remain in effect (meaning this test can be used) for the duration of the COVID-19 declaration under Section 564(b)(1) of the Act, 21 U.S.C. section 360bbb-3(b)(1), unless the authorization is terminated or revoked sooner. Performed at [facility], [address] [place], [place] [address]   Blood culture (routine x 2)     Status: None (Preliminary result)   Collection Time: 10/19/18  2:38 PM  Result Value Ref Range Status   Specimen Description   Final    BLOOD RIGHT  ANTECUBITAL Performed at [facility], [address] [place], [place] [address]    Special Requests   Final    BOTTLES DRAWN AEROBIC AND ANAEROBIC Blood Culture adequate volume Performed at [facility], [address] [place], [place] [address]    Culture   Final    NO GROWTH 2 DAYS Performed at [facility], [address] [place], [place] [address]    Report Status PENDING  Incomplete  Blood culture (routine x 2)     Status: None (Preliminary result)   Collection Time: 10/19/18  6:27 PM  Result Value Ref Range Status   Specimen Description   Final    BLOOD LEFT HAND Performed at [facility], [address] [place], [place] [address]    Special Requests   Final  BOTTLES DRAWN AEROBIC ONLY Blood Culture results may not be optimal due to an inadequate volume of blood received in culture bottles Performed at [facility], [address], [place], [place] [address]    Culture   Final    NO GROWTH 2 DAYS Performed at [facility], [address] [place], [place] [address]    Report Status PENDING  Incomplete  MRSA PCR Screening     Status: None   Collection Time: 10/19/18  6:57 PM  Result Value Ref Range Status   MRSA by PCR NEGATIVE NEGATIVE Final    Comment:        The GeneXpert MRSA Assay (FDA approved for NASAL specimens only), is one component of a comprehensive MRSA colonization surveillance program. It is not intended to diagnose MRSA infection nor to guide or monitor treatment for MRSA infections. Performed at [facility], [address] [place], [place] [address]   Surgical PCR screen     Status: None   Collection Time: 10/19/18  8:24 PM  Result Value Ref Range Status   MRSA, PCR NEGATIVE NEGATIVE Final   Staphylococcus aureus NEGATIVE NEGATIVE Final    Comment: (NOTE) The Xpert SA Assay (FDA approved for NASAL specimens in patients 34 years of age and  older), is one component of a comprehensive surveillance program. It is not intended to diagnose infection nor to guide or monitor treatment. Performed at [facility], [address], [place], [place] [address]      Labs: BNP (last 3 results) No results for input(s): BNP in the last 8760 hours. Basic Metabolic Panel: Recent Labs  Lab 10/19/18 0928 10/19/18 1827 10/19/18 1955 10/20/18 0020 10/20/18 0836  NA 122* 128* 126* 128* 134*  K 2.9* 3.5 3.5 3.5 3.9  CL 85* 93* 95* 96* 100  CO2 22 20* 19* 18* 21*  GLUCOSE 119* 105* 118* 104* 91  BUN 39* 29* 29* 28* 30*  CREATININE 1.69* 1.39* 1.28* 1.37* 1.30*  CALCIUM 7.5* 7.7* 7.5* 7.7* 7.9*  MG 2.1  --   --   --   --    Liver Function Tests: Recent Labs  Lab 10/19/18 0928 10/20/18 0020  AST 68* 48*  ALT 44 35  ALKPHOS 74 63  BILITOT 0.9 0.9  PROT 7.4 6.6  ALBUMIN 3.6 3.0*   Recent Labs  Lab 10/19/18 0928  LIPASE 24   No results for input(s): AMMONIA in the last 168 hours. CBC: Recent Labs  Lab 10/19/18 0928 10/20/18 0020  WBC 22.0* 16.2*  NEUTROABS 18.1*  --   HGB 15.3 13.8  HCT 44.9 41.0  MCV 94.1 95.3  PLT 179 175   Cardiac Enzymes: No results for input(s): CKTOTAL, CKMB, CKMBINDEX, TROPONINI in the last 168 hours. BNP: Invalid input(s): POCBNP CBG: No results for input(s): GLUCAP in the last 168 hours. D-Dimer No results for input(s): DDIMER in the last 72 hours. Hgb A1c No results for input(s): HGBA1C in the last 72 hours. Lipid Profile No results for input(s): CHOL, HDL, LDLCALC, TRIG, CHOLHDL, LDLDIRECT in the last 72 hours. Thyroid function studies No results for input(s): TSH, T4TOTAL, T3FREE, THYROIDAB in the last 72 hours.  Invalid input(s): FREET3 Anemia work up No results for input(s): VITAMINB12, FOLATE, FERRITIN, TIBC, IRON, RETICCTPCT in the last 72 hours. Urinalysis    Component Value Date/Time   COLORURINE YELLOW 10/19/2018 0853   APPEARANCEUR CLEAR  10/19/2018 0853   LABSPEC 1.008 10/19/2018 2947  PHURINE 5.0 10/19/2018 0853   GLUCOSEU NEGATIVE 10/19/2018 0853   HGBUR SMALL (A) 10/19/2018 0853   BILIRUBINUR NEGATIVE 10/19/2018 0853   KETONESUR NEGATIVE 10/19/2018 0853   PROTEINUR NEGATIVE 10/19/2018 0853   NITRITE NEGATIVE 10/19/2018 0853   LEUKOCYTESUR NEGATIVE 10/19/2018 0853   Sepsis Labs Invalid input(s): PROCALCITONIN,  WBC,  LACTICIDVEN Microbiology Recent Results (from the past 240 hour(s))  SARS Coronavirus 2 (CEPHEID - Performed in [facility] hospital lab), Hosp Order     Status: None   Collection Time: 10/19/18  2:38 PM  Result Value Ref Range Status   SARS Coronavirus 2 NEGATIVE NEGATIVE Final    Comment: (NOTE) If result is NEGATIVE SARS-CoV-2 target nucleic acids are NOT DETECTED. The SARS-CoV-2 RNA is generally detectable in upper and lower  respiratory specimens during the acute phase of infection. The lowest  concentration of SARS-CoV-2 viral copies this assay can detect is 250  copies / mL. A negative result does not preclude SARS-CoV-2 infection  and should not be used as the sole basis for treatment or other  patient management decisions.  A negative result may occur with  improper specimen collection / handling, submission of specimen other  than nasopharyngeal swab, presence of viral mutation(s) within the  areas targeted by this assay, and inadequate number of viral copies  (<250 copies / mL). A negative result must be combined with clinical  observations, patient history, and epidemiological information. If result is POSITIVE SARS-CoV-2 target nucleic acids are DETECTED. The SARS-CoV-2 RNA is generally detectable in upper and lower  respiratory specimens dur ing the acute phase of infection.  Positive  results are indicative of active infection with SARS-CoV-2.  Clinical  correlation with patient history and other diagnostic information is  necessary to determine patient infection status.   Positive results do  not rule out bacterial infection or co-infection with other viruses. If result is PRESUMPTIVE POSTIVE SARS-CoV-2 nucleic acids MAY BE PRESENT.   A presumptive positive result was obtained on the submitted specimen  and confirmed on repeat testing.  While 2019 novel coronavirus  (SARS-CoV-2) nucleic acids may be present in the submitted sample  additional confirmatory testing may be necessary for epidemiological  and / or clinical management purposes  to differentiate between  SARS-CoV-2 and other Sarbecovirus currently known to infect humans.  If clinically indicated additional testing with an alternate test  methodology [phone]) is advised. The SARS-CoV-2 RNA is generally  detectable in upper and lower respiratory sp ecimens during the acute  phase of infection. The expected result is Negative. Fact Sheet for Patients:  BoilerBrush.com.cy Fact Sheet for Healthcare Providers: https://pope.com/ This test is not yet approved or cleared by the [place] FDA and has been authorized for detection and/or diagnosis of SARS-CoV-2 by FDA under an Emergency Use Authorization (EUA).  This EUA will remain in effect (meaning this test can be used) for the duration of the COVID-19 declaration under Section 564(b)(1) of the Act, 21 U.S.C. section 360bbb-3(b)(1), unless the authorization is terminated or revoked sooner. Performed at [facility], [address], [place], [place] [address]   Blood culture (routine x 2)     Status: None (Preliminary result)   Collection Time: 10/19/18  2:38 PM  Result Value Ref Range Status   Specimen Description   Final    BLOOD RIGHT ANTECUBITAL Performed at [facility], [address] [place], [place] [address]    Special Requests   Final    BOTTLES DRAWN  AEROBIC AND ANAEROBIC Blood Culture adequate volume Performed at [facility], [address], [place], [place] [address]    Culture   Final    NO GROWTH 2 DAYS Performed at [facility], [address] [place], [place] [address]    Report Status PENDING  Incomplete  Blood culture (routine x 2)     Status: None (Preliminary result)   Collection Time: 10/19/18  6:27 PM  Result Value Ref Range Status   Specimen Description   Final    BLOOD LEFT HAND Performed at [facility], [address] [place], [place] [address]    Special Requests   Final    BOTTLES DRAWN AEROBIC ONLY Blood Culture results may not be optimal due to an inadequate volume of blood received in culture bottles Performed at [facility], [address] [place], [place] [address]    Culture   Final    NO GROWTH 2 DAYS Performed at [facility], [address], [place], [place] [address]    Report Status PENDING  Incomplete  MRSA PCR Screening     Status: None   Collection Time: 10/19/18  6:57 PM  Result Value Ref Range Status   MRSA by PCR NEGATIVE NEGATIVE Final    Comment:        The GeneXpert MRSA Assay (FDA approved for NASAL specimens only), is one component of a comprehensive MRSA colonization surveillance program. It is not intended to diagnose MRSA infection nor to guide or monitor treatment for MRSA infections. Performed at [facility], [address] [place], [place] [address]   Surgical PCR screen     Status: None   Collection Time: 10/19/18  8:24 PM  Result Value Ref Range Status   MRSA, PCR NEGATIVE NEGATIVE Final   Staphylococcus aureus NEGATIVE NEGATIVE Final    Comment: (NOTE) The Xpert SA Assay (FDA approved for NASAL specimens in patients 53 years of age and older), is one component of a comprehensive surveillance program. It is not intended to diagnose infection nor to guide or monitor treatment. Performed at [facility], [address] [place], [place] [address]      Please note: You were cared for by a hospitalist during your hospital stay. Once you are discharged, your primary care physician will handle any further medical issues. Please note that NO REFILLS for any discharge medications will be authorized once you are discharged, as it is imperative that you return to your primary care physician (or establish a relationship with a primary care physician if you do not have one) for your post hospital discharge needs so that they can reassess your need for medications and monitor your lab values.    Time coordinating discharge: 40 minutes  SIGNED:   Burnadette Pop, MD  [facility] 10/21/2018, 11:12 AM Pager [phone]  If 7PM-7AM, please contact night-coverage [email or web address] Password [email or web address]

## 2018-10-24 LAB — CULTURE, BLOOD (ROUTINE X 2)
Culture: NO GROWTH
Culture: NO GROWTH
Special Requests: ADEQUATE

## 2018-10-25 NOTE — ED Provider Notes (Signed)
[facility] Provider Note   CSN: [id number] Arrival date & time: 10/19/18  0831    History   Chief Complaint Chief Complaint  Patient presents with  . Abdominal Pain  . Headache    HPI Gary Neal is a 58 y.o. male.     HPI Patient presents to the emergency department with upper abdominal discomfort with diarrhea x3 days.  Patient can give me some history but is not very detail oriented in the reports.  Patient states that the pain seems to radiate up into his side.  The patient denies chest pain, shortness of breath, headache,blurred vision, neck pain, fever, cough, weakness, numbness, dizziness, anorexia, edema, nausea, vomiting,  rash, back pain, dysuria, hematemesis, bloody stool, near syncope, or syncope. Past Medical History:  Diagnosis Date  . Seizures [facility](HCC)     Patient Active Problem List   Diagnosis Date Noted  . Sepsis (HCC) secondary to acute cholecystitis 10/20/2018  . Pleural effusion on right 10/20/2018  . Hyponatremia 10/20/2018  . AKI (acute kidney injury) (HCC) 10/20/2018  . Hyperlipidemia 10/20/2018  . Essential hypertension 10/20/2018  . Mental disability 10/20/2018  . Acute cholecystitis 10/19/2018    Past Surgical History:  Procedure Laterality Date  . CHOLECYSTECTOMY N/A 10/20/2018   Procedure: LAPAROSCOPIC CHOLECYSTECTOMY;  Surgeon: Axel Filleramirez, Armando, MD;  Location: [facility] ORS;  Service: General;  Laterality: N/A;        Home Medications    Prior to Admission medications   Medication Sig Start Date End Date Taking? Authorizing Provider  acetaminophen (TYLENOL) 325 MG tablet Take 650 mg by mouth every 6 (six) hours as needed for mild pain or headache.   Yes [provider]  allopurinol (ZYLOPRIM) 100 MG tablet Take 100 mg by mouth daily.   Yes [provider]  cetirizine (ZYRTEC) 10 MG tablet Take 10 mg by mouth daily.   Yes [provider]  clotrimazole-betamethasone  (LOTRISONE) cream Apply 1 application topically 2 (two) times daily.   Yes [provider]  colchicine 0.6 MG tablet Take 0.6 mg by mouth daily.   Yes [provider]  Docusate Sodium (STOOL SOFTENER) 100 MG capsule Take 100 mg by mouth 2 (two) times daily.   Yes [provider]  donepezil (ARICEPT) 5 MG tablet Take 5 mg by mouth at bedtime.   Yes [provider]  ergocalciferol (VITAMIN D2) 1.25 MG (50000 UT) capsule Take 50,000 Units by mouth once a week.   Yes [provider]  metoprolol succinate (TOPROL-XL) 25 MG 24 hr tablet Take 25 mg by mouth 2 (two) times a day.    Yes [provider]  primidone (MYSOLINE) 250 MG tablet Take 250 mg by mouth 3 (three) times daily.    Yes [provider]  simvastatin (ZOCOR) 20 MG tablet Take 20 mg by mouth daily.   Yes [provider]  albuterol (PROVENTIL HFA;VENTOLIN HFA) 108 (90 Base) MCG/ACT inhaler Inhale 2 puffs into the lungs every 4 (four) hours as needed for wheezing or shortness of breath. Patient not taking: Reported on 10/19/2018 10/05/16   Isa RankinMurray, Laura Wilson, MD  ondansetron (ZOFRAN) 4 MG tablet Take 2 tablets (8 mg total) by mouth every 4 (four) hours as needed for nausea or vomiting. Patient not taking: Reported on 10/19/2018 10/05/16   Isa RankinMurray, Laura Wilson, MD  triamcinolone (NASACORT) 55 MCG/ACT AERO nasal inhaler Place 2 sprays into the nose daily. Patient not taking: Reported on 10/19/2018 10/05/16   Dayton ScrapeMurray,  Jadene Pierini, MD    Family History History reviewed. No pertinent family history.  Social History Social History   Tobacco Use  . Smoking status: Never Smoker  . Smokeless tobacco: Never Used  Substance Use Topics  . Alcohol use: Not on file  . Drug use: Not on file     Allergies   Patient has no known allergies.   Review of Systems Review of Systems All other systems negative except as documented in the HPI. All pertinent positives and negatives as  reviewed in the HPI.  Physical Exam Updated Vital Signs BP 129/71 (BP Location: Right Arm)   Pulse 81   Temp 98.7 F (37.1 C) (Oral)   Resp 18   Ht 5\' 11"  (1.803 m)   Wt 90.7 kg   SpO2 99%   BMI 27.89 kg/m   Physical Exam Vitals signs and nursing note reviewed.  Constitutional:      General: He is not in acute distress.    Appearance: He is well-developed.  HENT:     Head: Normocephalic and atraumatic.  Eyes:     Pupils: Pupils are equal, round, and reactive to light.  Neck:     Musculoskeletal: Normal range of motion and neck supple.  Cardiovascular:     Rate and Rhythm: Normal rate and regular rhythm.     Heart sounds: Normal heart sounds. No murmur. No friction rub. No gallop.   Pulmonary:     Effort: Pulmonary effort is normal. No respiratory distress.     Breath sounds: Normal breath sounds. No wheezing.  Abdominal:     General: Bowel sounds are normal. There is no distension.     Palpations: Abdomen is soft.     Tenderness: There is abdominal tenderness in the right upper quadrant and epigastric area. There is no guarding or rebound. Negative signs include Murphy's sign.     Hernia: No hernia is present.  Skin:    General: Skin is warm and dry.     Capillary Refill: Capillary refill takes less than 2 seconds.     Findings: No erythema or rash.  Neurological:     Mental Status: He is alert and oriented to person, place, and time.     Motor: No abnormal muscle tone.     Coordination: Coordination normal.  Psychiatric:        Behavior: Behavior normal.      ED Treatments / Results  Labs (all labs ordered are listed, but only abnormal results are displayed) Labs Reviewed  URINALYSIS, ROUTINE W REFLEX MICROSCOPIC - Abnormal; Notable for the following components:      Result Value   Hgb urine dipstick SMALL (*)    Bacteria, UA RARE (*)    All other components within normal limits  COMPREHENSIVE METABOLIC PANEL - Abnormal; Notable for the following  components:   Sodium 122 (*)    Potassium 2.9 (*)    Chloride 85 (*)    Glucose, Bld 119 (*)    BUN 39 (*)    Creatinine, Ser 1.69 (*)    Calcium 7.5 (*)    AST 68 (*)    GFR calc non Af Amer 43 (*)    GFR calc Af Amer 50 (*)    All other components within normal limits  CBC WITH DIFFERENTIAL/PLATELET - Abnormal; Notable for the following components:   WBC 22.0 (*)    Neutro Abs 18.1 (*)    Monocytes Absolute 2.5 (*)    Abs Immature Granulocytes  0.21 (*)    All other components within normal limits  BASIC METABOLIC PANEL - Abnormal; Notable for the following components:   Sodium 128 (*)    Chloride 93 (*)    CO2 20 (*)    Glucose, Bld 105 (*)    BUN 29 (*)    Creatinine, Ser 1.39 (*)    Calcium 7.7 (*)    GFR calc non Af Amer 55 (*)    All other components within normal limits  BASIC METABOLIC PANEL - Abnormal; Notable for the following components:   Sodium 126 (*)    Chloride 95 (*)    CO2 19 (*)    Glucose, Bld 118 (*)    BUN 29 (*)    Creatinine, Ser 1.28 (*)    Calcium 7.5 (*)    All other components within normal limits  CBC - Abnormal; Notable for the following components:   WBC 16.2 (*)    All other components within normal limits  COMPREHENSIVE METABOLIC PANEL - Abnormal; Notable for the following components:   Sodium 128 (*)    Chloride 96 (*)    CO2 18 (*)    Glucose, Bld 104 (*)    BUN 28 (*)    Creatinine, Ser 1.37 (*)    Calcium 7.7 (*)    Albumin 3.0 (*)    AST 48 (*)    GFR calc non Af Amer 56 (*)    All other components within normal limits  BASIC METABOLIC PANEL - Abnormal; Notable for the following components:   Sodium 134 (*)    CO2 21 (*)    BUN 30 (*)    Creatinine, Ser 1.30 (*)    Calcium 7.9 (*)    GFR calc non Af Amer 60 (*)    All other components within normal limits  SARS CORONAVIRUS 2 (HOSPITAL ORDER, PERFORMED IN [facility])  CULTURE, BLOOD (ROUTINE X 2)  CULTURE, BLOOD (ROUTINE X 2)  MRSA PCR SCREENING   SURGICAL PCR SCREEN  LACTIC ACID, PLASMA  LACTIC ACID, PLASMA  LIPASE, BLOOD  MAGNESIUM  HIV ANTIBODY (ROUTINE TESTING W REFLEX)  SURGICAL PATHOLOGY    EKG EKG Interpretation  Date/Time:  Monday October 19 2018 13:03:40 EDT Ventricular Rate:  103 PR Interval:    QRS Duration: 112 QT Interval:  345 QTC Calculation: 452 R Axis:   -10 Text Interpretation:  Sinus tachycardia Probable left atrial enlargement Incomplete right bundle branch block No old tracing to compare Confirmed by Linwood DibblesKnapp, Jon [phone]([id number]) on 10/19/2018 1:24:35 PM Also confirmed by Linwood DibblesKnapp, Jon [phone]), editor Barbette Hairassel, Kerry [phone]([id number])  on 10/19/2018 2:06:28 PM   Radiology No results found.  Procedures Procedures (including critical care time)  Medications Ordered in ED Medications  sodium chloride (PF) 0.9 % injection (has no administration in time range)  sodium chloride 0.9 % bolus 1,000 mL (0 mLs Intravenous Stopped 10/19/18 1214)  iohexol (OMNIPAQUE) 300 MG/ML solution 100 mL (100 mLs Intravenous Contrast Given 10/19/18 1030)  potassium chloride 10 mEq in 100 mL IVPB (0 mEq Intravenous Stopped 10/19/18 1627)  piperacillin-tazobactam (ZOSYN) IVPB 3.375 g (0 g Intravenous Stopped 10/19/18 1627)  piperacillin-tazobactam (ZOSYN) IVPB 3.375 g (3.375 g Intravenous New Bag/Given 10/21/18 0510)     Initial Impression / Assessment and Plan / ED Course  I have reviewed the triage vital signs and the nursing notes.  Pertinent labs & imaging results that were available during my care of the patient were reviewed by me and considered  in my medical decision making (see chart for details).        I spoke with general surgery and the [facility] about the patient.  Will be admitted to the hospital for cholecystitis.  Patient has been stable here in the emergency department.  Patient is advised the plan and all questions were answered for him as best we can.  Final Clinical Impressions(s) / ED Diagnoses   Final diagnoses:   Cholecystitis    ED Discharge Orders         Ordered    Increase activity slowly     10/21/18 1103    Diet - low sodium heart healthy     10/21/18 1103    Discharge instructions  Status:  Canceled    Comments:  1)Please follow up with general surgery as an outpatient ion 11/03/18. 2)Continue your home medicines. 3)Follow up with your PCP in a week. Do a CBC and BMP tests during the follow up.   10/21/18 1103    Discharge instructions    Comments:  1)Please follow up with general surgery as an outpatient ion 11/03/18. 2)Follow up with your PCP in a week. Do a CBC and BMP tests during the follow up. 3)Take your home medicines as instructed.   10/21/18 1112           Charlestine NightLawyer, Alcus Bradly, PA-C 10/25/18 1554    Mancel BaleWentz, Elliott, MD 10/26/18 1453

## 2018-10-26 ENCOUNTER — Emergency Department (HOSPITAL_COMMUNITY)
Admission: EM | Admit: 2018-10-26 | Discharge: 2018-10-27 | Disposition: A | Discharge status: Home / Self Care | Payer: Medicare HMO | Attending: Emergency Medicine | Admitting: Emergency Medicine

## 2018-10-26 ENCOUNTER — Emergency Department (HOSPITAL_COMMUNITY): Payer: Medicare HMO

## 2018-10-26 ENCOUNTER — Encounter (HOSPITAL_COMMUNITY): Payer: Self-pay | Admitting: Student

## 2018-10-26 DIAGNOSIS — Z79899 Other long term (current) drug therapy: Secondary | ICD-10-CM | POA: Insufficient documentation

## 2018-10-26 DIAGNOSIS — R300 Dysuria: Secondary | ICD-10-CM | POA: Diagnosis present

## 2018-10-26 DIAGNOSIS — G8918 Other acute postprocedural pain: Secondary | ICD-10-CM | POA: Diagnosis not present

## 2018-10-26 DIAGNOSIS — R1011 Right upper quadrant pain: Secondary | ICD-10-CM | POA: Insufficient documentation

## 2018-10-26 DIAGNOSIS — N481 Balanitis: Secondary | ICD-10-CM | POA: Diagnosis not present

## 2018-10-26 DIAGNOSIS — I10 Essential (primary) hypertension: Secondary | ICD-10-CM | POA: Diagnosis not present

## 2018-10-26 DIAGNOSIS — F79 Unspecified intellectual disabilities: Secondary | ICD-10-CM | POA: Diagnosis not present

## 2018-10-26 DIAGNOSIS — Z20828 Contact with and (suspected) exposure to other viral communicable diseases: Secondary | ICD-10-CM | POA: Diagnosis not present

## 2018-10-26 LAB — COMPREHENSIVE METABOLIC PANEL
ALT: 56 U/L — ABNORMAL HIGH (ref 0–44)
AST: 54 U/L — ABNORMAL HIGH (ref 15–41)
Albumin: 3.2 g/dL — ABNORMAL LOW (ref 3.5–5.0)
Alkaline Phosphatase: 90 U/L (ref 38–126)
Anion gap: 11 (ref 5–15)
BUN: 8 mg/dL (ref 6–20)
CO2: 26 mmol/L (ref 22–32)
Calcium: 8.5 mg/dL — ABNORMAL LOW (ref 8.9–10.3)
Chloride: 105 mmol/L (ref 98–111)
Creatinine, Ser: 0.8 mg/dL (ref 0.61–1.24)
GFR calc Af Amer: >60 mL/min (ref >60)
GFR calc non Af Amer: >60 mL/min (ref >60)
Glucose, Bld: 101 mg/dL — ABNORMAL HIGH (ref 70–99)
Potassium: 4.2 mmol/L (ref 3.5–5.1)
Sodium: 142 mmol/L (ref 135–145)
Total Bilirubin: 0.3 mg/dL (ref 0.3–1.2)
Total Protein: 6.8 g/dL (ref 6.5–8.1)

## 2018-10-26 LAB — URINALYSIS, ROUTINE W REFLEX MICROSCOPIC
Bilirubin Urine: NEGATIVE
Glucose, UA: NEGATIVE mg/dL
Hgb urine dipstick: NEGATIVE
Ketones, ur: NEGATIVE mg/dL
Leukocytes,Ua: NEGATIVE
Nitrite: NEGATIVE
Protein, ur: NEGATIVE mg/dL
Specific Gravity, Urine: 1.008 (ref 1.005–1.030)
pH: 7 (ref 5.0–8.0)

## 2018-10-26 LAB — CBC WITH DIFFERENTIAL/PLATELET
Abs Immature Granulocytes: 0.15 10*3/uL — ABNORMAL HIGH (ref 0.00–0.07)
Basophils Absolute: 0.1 10*3/uL (ref 0.0–0.1)
Basophils Relative: 1 %
Eosinophils Absolute: 0.2 10*3/uL (ref 0.0–0.5)
Eosinophils Relative: 1 %
HCT: 40.6 % (ref 39.0–52.0)
Hemoglobin: 13.8 g/dL (ref 13.0–17.0)
Immature Granulocytes: 1 %
Lymphocytes Relative: 18 %
Lymphs Abs: 1.9 10*3/uL (ref 0.7–4.0)
MCH: 32.5 pg (ref 26.0–34.0)
MCHC: 34 g/dL (ref 30.0–36.0)
MCV: 95.8 fL (ref 80.0–100.0)
Monocytes Absolute: 0.9 10*3/uL (ref 0.1–1.0)
Monocytes Relative: 9 %
Neutro Abs: 7.3 10*3/uL (ref 1.7–7.7)
Neutrophils Relative %: 70 %
Platelets: 374 10*3/uL (ref 150–400)
RBC: 4.24 MIL/uL (ref 4.22–5.81)
RDW: 14.8 % (ref 11.5–15.5)
WBC: 10.5 10*3/uL (ref 4.0–10.5)
nRBC: 0 % (ref 0.0–0.2)

## 2018-10-26 LAB — LIPASE, BLOOD: Lipase: 39 U/L (ref 11–51)

## 2018-10-26 LAB — SARS CORONAVIRUS 2 BY RT PCR (HOSPITAL ORDER, PERFORMED IN ~~LOC~~ HOSPITAL LAB): SARS Coronavirus 2: NEGATIVE

## 2018-10-26 MED ORDER — ONDANSETRON HCL 4 MG/2ML IJ SOLN
4.0000 mg | Freq: Once | INTRAMUSCULAR | Status: AC
  Administered 2018-10-26: 4 mg via INTRAVENOUS
  Filled 2018-10-26: qty 2

## 2018-10-26 MED ORDER — OXYCODONE-ACETAMINOPHEN 5-325 MG PO TABS
1.0000 | ORAL_TABLET | Freq: Once | ORAL | Status: DC
  Filled 2018-10-26: qty 1

## 2018-10-26 MED ORDER — AMOXICILLIN-POT CLAVULANATE 875-125 MG PO TABS: 1.0000 | ORAL_TABLET | Freq: Two times a day (BID) | ORAL | 0 refills | Status: DC

## 2018-10-26 MED ORDER — CLOTRIMAZOLE 1 % EX CREA: TOPICAL_CREAM | CUTANEOUS | 0 refills | Status: AC

## 2018-10-26 MED ORDER — AMOXICILLIN-POT CLAVULANATE 875-125 MG PO TABS
1.0000 | ORAL_TABLET | Freq: Once | ORAL | Status: DC
  Filled 2018-10-26: qty 1

## 2018-10-26 MED ORDER — CLOTRIMAZOLE 1 % EX CREA
TOPICAL_CREAM | Freq: Two times a day (BID) | CUTANEOUS | Status: DC
  Administered 2018-10-27: via TOPICAL
  Filled 2018-10-26: qty 15

## 2018-10-26 MED ORDER — OXYCODONE-ACETAMINOPHEN 5-325 MG PO TABS: 1.0000 | ORAL_TABLET | Freq: Four times a day (QID) | ORAL | 0 refills | Status: DC | PRN

## 2018-10-26 MED ORDER — MORPHINE SULFATE (PF) 4 MG/ML IV SOLN
4.0000 mg | Freq: Once | INTRAVENOUS | Status: AC
  Administered 2018-10-26: 4 mg via INTRAVENOUS
  Filled 2018-10-26: qty 1

## 2018-10-26 MED ORDER — IOHEXOL 300 MG/ML  SOLN
100.0000 mL | Freq: Once | INTRAMUSCULAR | Status: AC | PRN
  Administered 2018-10-26: 100 mL via INTRAVENOUS

## 2018-10-26 MED ORDER — SODIUM CHLORIDE (PF) 0.9 % IJ SOLN
INTRAMUSCULAR | Status: AC
  Filled 2018-10-26: qty 50

## 2018-10-26 NOTE — ED Notes (Signed)
Pt transported to CT. Alert and in no distress

## 2018-10-26 NOTE — ED Notes (Addendum)
PA spoke with group home. Pt will need [facility] to transport pt back to facility

## 2018-10-26 NOTE — ED Notes (Signed)
Urine and culture sent to lab  

## 2018-10-26 NOTE — Discharge Instructions (Addendum)
Gary Neal you was seen in the emergency department today for abdominal pain and penile irritation.  Your CT scan in the emergency department showed that there is some fluid and gas within your abdomen which may be related to typical changes after surgery, however there is concern that this could also be an infection.  We talked with the surgeon on-call for Dr. Rosendo Gros, the surgeon who did your surgery, they have recommended we start you on antibiotics, Augmentin.  Please take the Augmentin twice per day for the next 7 days.  The general surgery group would like you to follow-up at their clinic within the next week, please try to follow-up before the upcoming weekend. They may contact you in order to have you get repeat blood work before the visit.  Taking your antibiotic and following up at the appointment is extremely important.   We also send you home with Percocet to take as needed for severe pain  -Percocet-this is a narcotic/controlled substance medication that has potential addicting qualities.  We recommend that you take 1-2 tablets every 6 hours as needed for severe pain.  Do not drive or operate heavy machinery when taking this medicine as it can be sedating. Do not drink alcohol or take other sedating medications when taking this medicine for safety reasons.  Keep this out of reach of small children.  Please be aware this medicine has Tylenol in it (325 mg/tab) do not exceed the maximum dose of Tylenol in a day per over the counter recommendations should you decide to supplement with Tylenol over the counter.    Regarding your genital irritation, we suspect this may be a yeast infection, we have prescribed you clotrimazole cream, please apply this twice per day until symptoms have improved.  Follow-up with primary care for reevaluation of this area within 3 to 5 days.  Return to the emergency department immediately for new or worsening symptoms including but not limited to worsening  pain, fever, inability to keep fluids down, increased redness around the penis, testicular pain/swelling, inability to urinate, blood in your urine, or any other concerns.

## 2018-10-26 NOTE — ED Notes (Signed)
Pt back from radiology 

## 2018-10-26 NOTE — ED Triage Notes (Signed)
Transported by [facility] from group home-- had gallbladder removed 1 week ago and states that this morning at 6 am he began to experience dysuria. Staff from group home also noted some AMS as of 1600 today (pt more lethargic and not as talkative.)

## 2018-10-26 NOTE — ED Provider Notes (Signed)
[facility]-EMERGENCY DEPT Provider Note   CSN: [id number] Arrival date & time: 10/26/18  1845    History   Chief Complaint Chief Complaint  Patient presents with  . Dysuria    HPI Gary Neal is a 58 y.o. male with history of seizures, hyperlipidemia, HTN, mental disability, and recent cholecystectomy who presents to the emergency department w/ complaints of penile irritation that began today and abdominal pain since leaving the hospital. Patient with recent hospital admission 6/1 to 6/3 for acute gangrenous cholecystitis w/ sepsis, underwent laparoscopic cholecystectomy by general surgery, blood cultures w/o growth, abx discontinued, cleared by surgery & discharged home. States he has had some generalized abdominal discomfort worse in RUQ since leaving the hospital that is constant, possibly somewhat worse over the past 24-48 hours. NO alleviating/aggravating factors. He also notes that today he developed some burning w/ urination and irritation to the distal aspect of his penis. Group home report states he was less talkative than usual today. Denies fever, chills, nausea, vomiting, melena, hematochezia,  Hematuria, scrotal pain/swelling, or penile discharge.     HPI  Past Medical History:  Diagnosis Date  . Seizures [facility](HCC)     Patient Active Problem List   Diagnosis Date Noted  . Sepsis (HCC) secondary to acute cholecystitis 10/20/2018  . Pleural effusion on right 10/20/2018  . Hyponatremia 10/20/2018  . AKI (acute kidney injury) (HCC) 10/20/2018  . Hyperlipidemia 10/20/2018  . Essential hypertension 10/20/2018  . Mental disability 10/20/2018  . Acute cholecystitis 10/19/2018    Past Surgical History:  Procedure Laterality Date  . CHOLECYSTECTOMY N/A 10/20/2018   Procedure: LAPAROSCOPIC CHOLECYSTECTOMY;  Surgeon: Axel Filleramirez, Armando, MD;  Location: [facility] ORS;  Service: General;  Laterality: N/A;        Home Medications    Prior to Admission  medications   Medication Sig Start Date End Date Taking? Authorizing Provider  acetaminophen (TYLENOL) 325 MG tablet Take 650 mg by mouth every 6 (six) hours as needed for mild pain or headache.    [provider]  albuterol (PROVENTIL HFA;VENTOLIN HFA) 108 (90 Base) MCG/ACT inhaler Inhale 2 puffs into the lungs every 4 (four) hours as needed for wheezing or shortness of breath. Patient not taking: Reported on 10/19/2018 10/05/16   Isa RankinMurray, Laura Wilson, MD  allopurinol (ZYLOPRIM) 100 MG tablet Take 100 mg by mouth daily.    [provider]  cetirizine (ZYRTEC) 10 MG tablet Take 10 mg by mouth daily.    [provider]  clotrimazole-betamethasone (LOTRISONE) cream Apply 1 application topically 2 (two) times daily.    [provider]  colchicine 0.6 MG tablet Take 0.6 mg by mouth daily.    [provider]  Docusate Sodium (STOOL SOFTENER) 100 MG capsule Take 100 mg by mouth 2 (two) times daily.    [provider]  donepezil (ARICEPT) 5 MG tablet Take 5 mg by mouth at bedtime.    [provider]  ergocalciferol (VITAMIN D2) 1.25 MG (50000 UT) capsule Take 50,000 Units by mouth once a week.    [provider]  metoprolol succinate (TOPROL-XL) 25 MG 24 hr tablet Take 25 mg by mouth 2 (two) times a day.     [provider]  ondansetron (ZOFRAN) 4 MG tablet Take 2 tablets (8 mg total) by mouth every 4 (four) hours as needed for nausea or vomiting. Patient not taking: Reported on 10/19/2018 10/05/16   Isa RankinMurray, Laura Wilson, MD  primidone (MYSOLINE) 250 MG tablet Take 250  mg by mouth 3 (three) times daily.     [provider]  simvastatin (ZOCOR) 20 MG tablet Take 20 mg by mouth daily.    [provider]  triamcinolone (NASACORT) 55 MCG/ACT AERO nasal inhaler Place 2 sprays into the nose daily. Patient not taking: Reported on 10/19/2018 10/05/16   Isa RankinMurray, Laura Wilson, MD    Family History No family history on file.   Social History Social History   Tobacco Use  . Smoking status: Never Smoker  . Smokeless tobacco: Never Used  Substance Use Topics  . Alcohol use: Not on file  . Drug use: Not on file     Allergies   Patient has no known allergies.   Review of Systems Review of Systems  Constitutional: Negative for chills and fever.  Respiratory: Negative for shortness of breath.   Cardiovascular: Negative for chest pain.  Gastrointestinal: Positive for abdominal pain. Negative for blood in stool, constipation, nausea and vomiting.  Genitourinary: Positive for dysuria and penile pain. Negative for hematuria, scrotal swelling and testicular pain.  All other systems reviewed and are negative.    Physical Exam Updated Vital Signs BP (!) 159/85 (BP Location: Left Arm)   Pulse 85   Temp 98.6 F (37 C) (Oral)   Resp (!) 25   SpO2 98%   Physical Exam Vitals signs and nursing note reviewed. Exam conducted with a chaperone present.  Constitutional:      General: He is not in acute distress.    Appearance: He is well-developed. He is not toxic-appearing.  HENT:     Head: Normocephalic and atraumatic.  Eyes:     General:        Right eye: No discharge.        Left eye: No discharge.     Conjunctiva/sclera: Conjunctivae normal.  Neck:     Musculoskeletal: Neck supple.  Cardiovascular:     Rate and Rhythm: Normal rate and regular rhythm.  Pulmonary:     Effort: Pulmonary effort is normal. No respiratory distress.     Breath sounds: Normal breath sounds. No wheezing, rhonchi or rales.  Abdominal:     General: There is no distension.     Palpations: Abdomen is soft.     Tenderness: There is abdominal tenderness (generalized, more so in RUQ). There is no guarding or rebound.     Comments: Surgical incision sites w/ dermabond in place consistent w/ recent laparoscopic cholecystectomy. Mild surrounding bruising to sites. NO significant erythema/drainage.   Genitourinary:    Penis:  Uncircumcised. Erythema (mild erythema to the glans & foreskin, no palpable fluctuance or induration) present. No phimosis, paraphimosis, discharge or lesions.      Scrotum/Testes:        Right: Tenderness or swelling not present.        Left: Tenderness or swelling not present.     Epididymis:     Right: Not inflamed. No tenderness.     Left: Not inflamed. No tenderness.  Skin:    General: Skin is warm and dry.     Findings: No rash.  Neurological:     Mental Status: He is alert.     Comments: Clear speech.   Psychiatric:        Behavior: Behavior normal.    ED Treatments / Results  Labs (all labs ordered are listed, but only abnormal results are displayed) Labs Reviewed  CBC WITH DIFFERENTIAL/PLATELET - Abnormal; Notable for the following components:      Result  Value   Abs Immature Granulocytes 0.15 (*)    All other components within normal limits  URINALYSIS, ROUTINE W REFLEX MICROSCOPIC - Abnormal; Notable for the following components:   Color, Urine STRAW (*)    All other components within normal limits  COMPREHENSIVE METABOLIC PANEL - Abnormal; Notable for the following components:   Glucose, Bld 101 (*)    Calcium 8.5 (*)    Albumin 3.2 (*)    AST 54 (*)    ALT 56 (*)    All other components within normal limits  SARS CORONAVIRUS 2 (HOSPITAL ORDER, PERFORMED IN [facility])  URINE CULTURE  LIPASE, BLOOD  GC/CHLAMYDIA PROBE AMP ([place]) NOT AT [facility]    EKG None  Radiology Ct Abdomen Pelvis W Contrast  Result Date: 10/26/2018 CLINICAL DATA:  Initial evaluation for acute abdominal pain, recent cholecystectomy. EXAM: CT ABDOMEN AND PELVIS WITH CONTRAST TECHNIQUE: Multidetector CT imaging of the abdomen and pelvis was performed using the standard protocol following bolus administration of intravenous contrast. CONTRAST:  100mL OMNIPAQUE IOHEXOL 300 MG/ML  SOLN COMPARISON:  Prior CT from 10/19/2018. FINDINGS: Lower chest: Small layering right pleural  effusion with associated right basilar atelectasis. Mild scattered subsegmental atelectatic changes present at the left lung base. Hepatobiliary: Liver demonstrates a normal contrast enhanced appearance. Postoperative changes from recent cholecystectomy. Residual collection within the gallbladder fossa measures approximately 7.3 x 4.7 x 5.1 cm (series 2, image 19). Collection is heterogeneous in appearance with multiple scattered internal foci of gas. While this finding could reflect a postoperative collection/seroma, the presence of internal gas raises the possibility for infection with abscess formation. Possible biloma (which could also be infected) could also be considered. Surrounding hazy stranding within the adjacent right upper quadrant. No biliary dilatation. Pancreas: Pancreas within normal limits. Spleen: Spleen within normal limits. Adrenals/Urinary Tract: Adrenal glands are normal. Kidneys equal in size with symmetric enhancement no nephrolithiasis, hydronephrosis, or focal enhancing renal mass. Subcentimeter hypodensity within the right kidney too small the characterize, but statistically likely reflects a small cyst. A no hydroureter. Bladder moderately distended without acute finding. Small diverticulum noted extending from the right posterolateral aspect of the bladder. Stomach/Bowel: Stomach within normal limits. No evidence for bowel obstruction. Hazy stranding surrounds the colon at the level of the hepatic flexure, likely postoperative in nature from recent cholecystectomy. No other acute inflammatory changes seen about the bowels. Vascular/Lymphatic: Normal intravascular enhancement seen throughout the intra-abdominal aorta and its branch vessels. No adenopathy. Reproductive: Mildly enlarged prostate measuring 5 cm in transverse diameter. Other: No free air or fluid. Scattered soft tissue stranding with emphysema within the central and right anterior abdominal wall consistent with postoperative  changes. Musculoskeletal: No acute osseous abnormality. No worrisome lytic or blastic osseous lesions. Few scattered benign bone islands noted. IMPRESSION: 1. Postoperative changes from recent cholecystectomy. Residual collection within the gallbladder fossa measuring 7.3 x 4.7 x 5.1 cm with multiple internal foci of gas. While this finding could reflect a postoperative collection/seroma, the presence of internal gas raises the possibility for infection with abscess formation. Possible biloma could also be considered. 2. Small layering right pleural effusion with associated right basilar atelectasis. 3. No other acute intra-abdominal or pelvic process. Electronically Signed   By: Rise MuBenjamin  McClintock M.D.   On: 10/26/2018 21:46    Procedures Procedures (including critical care time)  Medications Ordered in ED Medications - No data to display   Initial Impression / Assessment and Plan / ED Course  I have reviewed  the triage vital signs and the nursing notes.  Pertinent labs & imaging results that were available during my care of the patient were reviewed by me and considered in my medical decision making (see chart for details).  Clinical Course as of Oct 27 10  Tue Oct 27, 2018  7209 Basic metabolic panel [SP]    Clinical Course User Index [SP] Amaryllis Dyke, PA-C  Patient w/ recent cholecystectomy presents to the ED w/ complaints of continued and maybe slightly worsened abdominal pain as well as complaints of penile irritation w/ erythema & burning. Nontoxic appearing, vitals without significant abnormality, BP elevated, doubt HTN emergency. On exam patient's surgical incision sites do not appear infected, he has generalized tenderness which is worse in the RUQ w/o peritoneal signs- will proceed w/ CT for further assessment. He appears to have findings consistent w/ balanitis on examination of genitalia given erythema & description of his sxs, does not appear to be cellulitic,  abscess, or forniers, no phimosis/paraphimosis, no testicular pain/swelling/tenderness.   UA w/o UTI.  CBC: no leukocytosis/anemia CMP: mild elevation in LFTs. Renal function preserved. Mild hypocalcemia & hypoalbuminemia.  Lipase: WNL CT abdomen/pelvis: Detailed report above. Post op changes from recent cholecystectomy. Residual collection within gallbladder fossa w/ foci of gas- postop collection/seroma vs. Possible infection w/ abscess vs biloma per radiology read. NO other acute intra-abdominal/pelvic pathology.   Will discuss w/ general surgery.  22:22: CONSULT: Discussed patient case w/ general surgeon Dr. Lucia Gaskins who has recommended we discharge patient home on Augmentin BID w/ follow up in clinic w/ Dr. Rosendo Gros within the week, may need repeat CBC prior to visit which the office will coordinate.  Relays lack of fever/leukocytosis is reassuring.   Plan carried out as discussed w/ general surgery. Will also provide percocet for pain control, [place] Controlled Substance reporting System queried, printed prescription given to patient given time of night in case this would like to be filled. Will start clotrimazole for suspected balanitis- PCP follow up for this. First does of all medications given in the ED prior to discharge. I discussed results, treatment plan, need for follow-up, and return precautions with the patient. Provided opportunity for questions, patient confirmed understanding and is in agreement with plan.   I also called and spoke with [place] [facility] [place] group [facility] w/ patient consent, she knows the patient very well, aware of results, medication prescribed, & need for general surgery follow up as well.   Final Clinical Impressions(s) / ED Diagnoses   Final diagnoses:  Balanitis  Post-op pain    ED Discharge Orders         Ordered    amoxicillin-clavulanate (AUGMENTIN) 875-125 MG tablet  Every 12 hours     10/26/18 2352    clotrimazole (LOTRIMIN) 1 % cream      10/26/18 2352    oxyCODONE-acetaminophen (PERCOCET/ROXICET) 5-325 MG tablet  Every 6 hours PRN     10/26/18 [address], [place] R, PA-C 10/27/18 0020    Charlesetta Shanks, MD 10/27/18 1435

## 2018-10-27 DIAGNOSIS — N481 Balanitis: Secondary | ICD-10-CM | POA: Diagnosis not present

## 2018-10-27 NOTE — ED Notes (Addendum)
Ptar contacted and paperwork printed  

## 2018-10-27 NOTE — ED Notes (Addendum)
PTAR at bedside 

## 2018-10-29 LAB — URINE CULTURE: Culture: 50000 — AB

## 2018-10-30 ENCOUNTER — Telehealth: Payer: Self-pay | Admitting: Emergency Medicine

## 2018-10-30 ENCOUNTER — Emergency Department (HOSPITAL_COMMUNITY)
Admission: EM | Admit: 2018-10-30 | Discharge: 2018-10-31 | Disposition: A | Discharge status: Home / Self Care | Payer: Medicare HMO | Attending: Emergency Medicine | Admitting: Emergency Medicine

## 2018-10-30 ENCOUNTER — Other Ambulatory Visit: Payer: Self-pay

## 2018-10-30 DIAGNOSIS — Z79899 Other long term (current) drug therapy: Secondary | ICD-10-CM | POA: Diagnosis not present

## 2018-10-30 DIAGNOSIS — I10 Essential (primary) hypertension: Secondary | ICD-10-CM | POA: Diagnosis not present

## 2018-10-30 DIAGNOSIS — R456 Violent behavior: Secondary | ICD-10-CM | POA: Insufficient documentation

## 2018-10-30 DIAGNOSIS — R531 Weakness: Secondary | ICD-10-CM | POA: Insufficient documentation

## 2018-10-30 DIAGNOSIS — R451 Restlessness and agitation: Secondary | ICD-10-CM | POA: Insufficient documentation

## 2018-10-30 DIAGNOSIS — Z046 Encounter for general psychiatric examination, requested by authority: Secondary | ICD-10-CM | POA: Insufficient documentation

## 2018-10-30 DIAGNOSIS — R1033 Periumbilical pain: Secondary | ICD-10-CM | POA: Insufficient documentation

## 2018-10-30 DIAGNOSIS — R42 Dizziness and giddiness: Secondary | ICD-10-CM | POA: Insufficient documentation

## 2018-10-30 DIAGNOSIS — R51 Headache: Secondary | ICD-10-CM | POA: Insufficient documentation

## 2018-10-30 DIAGNOSIS — F79 Unspecified intellectual disabilities: Secondary | ICD-10-CM | POA: Insufficient documentation

## 2018-10-30 DIAGNOSIS — R112 Nausea with vomiting, unspecified: Secondary | ICD-10-CM | POA: Diagnosis not present

## 2018-10-30 DIAGNOSIS — Z9114 Patient's other noncompliance with medication regimen: Secondary | ICD-10-CM | POA: Insufficient documentation

## 2018-10-30 NOTE — ED Notes (Signed)
Bed: ZT24 Expected date:  Expected time:  Means of arrival:  Comments: EMS MR from group home/fever-not eating or taking meds

## 2018-10-30 NOTE — ED Triage Notes (Signed)
Pt BIB in [facility] from group home with MR. EMS reports pt has been altered today, refusing medications, not eating, and combative with staff. Pt also c/o abd pain, headache, and feeling weak allover.   Pt was given 5mg  of Midazolam by EMS to help pt relax.

## 2018-10-30 NOTE — ED Provider Notes (Signed)
[place] DEPT Provider Note   CSN: [id number] Arrival date & time: 10/30/18  2253    History   Chief Complaint Chief Complaint  Patient presents with  . Weakness  . Abdominal Pain    HPI Gary Neal is a 58 y.o. male.     Patient complains of headache and dizziness that started earlier today. He reports that he is weak and has been experiencing nausea and vomiting. He has been experiencing mid abdominal pain.   He comes to the ER by EMS from group home where staff had noted increased agitation, becoming combative with staff. He has refused medications and has not been eating today.     Past Medical History:  Diagnosis Date  . Seizures [facility])     Patient Active Problem List   Diagnosis Date Noted  . Sepsis ([place]) secondary to acute cholecystitis 10/20/2018  . Pleural effusion on right 10/20/2018  . Hyponatremia 10/20/2018  . AKI (acute kidney injury) ([place]) 10/20/2018  . Hyperlipidemia 10/20/2018  . Essential hypertension 10/20/2018  . Mental disability 10/20/2018  . Acute cholecystitis 10/19/2018    Past Surgical History:  Procedure Laterality Date  . CHOLECYSTECTOMY N/A 10/20/2018   Procedure: LAPAROSCOPIC CHOLECYSTECTOMY;  Surgeon: [name], MD;  Location: [facility];  Service: General;  Laterality: N/A;        Home Medications    Prior to Admission medications   Medication Sig Start Date End Date Taking? Authorizing Provider  acetaminophen (TYLENOL) 325 MG tablet Take 650 mg by mouth every 6 (six) hours as needed for mild pain or headache.    [provider]  albuterol (PROVENTIL HFA;VENTOLIN HFA) 108 (90 Base) MCG/ACT inhaler Inhale 2 puffs into the lungs every 4 (four) hours as needed for wheezing or shortness of breath. Patient not taking: Reported on 10/19/2018 10/05/16   Wynona Luna, MD  allopurinol (ZYLOPRIM) 100 MG tablet Take 100 mg by mouth daily.    [provider]   amoxicillin-clavulanate (AUGMENTIN) 875-125 MG tablet Take 1 tablet by mouth every 12 (twelve) hours. 10/26/18   Petrucelli, Samantha R, PA-C  cetirizine (ZYRTEC) 10 MG tablet Take 10 mg by mouth daily.    [provider]  clotrimazole (LOTRIMIN) 1 % cream Apply to affected area (glans penis/foreskin) 2 times daily 10/26/18   Petrucelli, Samantha R, PA-C  clotrimazole-betamethasone (LOTRISONE) cream Apply 1 application topically 2 (two) times daily.    [provider]  colchicine 0.6 MG tablet Take 0.6 mg by mouth daily.    [provider]  Docusate Sodium (STOOL SOFTENER) 100 MG capsule Take 100 mg by mouth 2 (two) times daily.    [provider]  donepezil (ARICEPT) 5 MG tablet Take 5 mg by mouth at bedtime.    [provider]  ergocalciferol (VITAMIN D2) 1.25 MG (50000 UT) capsule Take 50,000 Units by mouth once a week.    [provider]  metoprolol succinate (TOPROL-XL) 25 MG 24 hr tablet Take 25 mg by mouth 2 (two) times a day.     [provider]  ondansetron (ZOFRAN) 4 MG tablet Take 2 tablets (8 mg total) by mouth every 4 (four) hours as needed for nausea or vomiting. Patient not taking: Reported on 10/19/2018 10/05/16   Wynona Luna, MD  oxyCODONE-acetaminophen (PERCOCET/ROXICET) 5-325 MG tablet Take 1-2 tablets by mouth every 6 (six) hours as needed for severe pain. 10/26/18   Petrucelli, Samantha R, PA-C  primidone (MYSOLINE) 250 MG tablet Take  250 mg by mouth 3 (three) times daily.     [provider]  simvastatin (ZOCOR) 20 MG tablet Take 20 mg by mouth daily.    [provider]  triamcinolone (NASACORT) 55 MCG/ACT AERO nasal inhaler Place 2 sprays into the nose daily. Patient not taking: Reported on 10/19/2018 10/05/16   Isa RankinMurray, Laura Wilson, MD    Family History No family history on file.  Social History Social History   Tobacco Use  . Smoking status: Never Smoker  . Smokeless tobacco: Never Used   Substance Use Topics  . Alcohol use: Not on file  . Drug use: Not on file     Allergies   Patient has no known allergies.   Review of Systems Review of Systems  Gastrointestinal: Positive for abdominal pain, nausea and vomiting.  Neurological: Positive for dizziness and headaches.  Psychiatric/Behavioral: Positive for agitation.  All other systems reviewed and are negative.    Physical Exam Updated Vital Signs BP (!) 151/81   Pulse 79   Temp 98.5 F (36.9 C)   Resp 20   SpO2 96%   Physical Exam Vitals signs and nursing note reviewed.  Constitutional:      General: He is not in acute distress.    Appearance: Normal appearance. He is well-developed.  HENT:     Head: Normocephalic and atraumatic.     Right Ear: Hearing normal.     Left Ear: Hearing normal.     Nose: Nose normal.  Eyes:     Conjunctiva/sclera: Conjunctivae normal.     Pupils: Pupils are equal, round, and reactive to light.  Neck:     Musculoskeletal: Normal range of motion and neck supple.  Cardiovascular:     Rate and Rhythm: Regular rhythm.     Heart sounds: S1 normal and S2 normal. No murmur. No friction rub. No gallop.   Pulmonary:     Effort: Pulmonary effort is normal. No respiratory distress.     Breath sounds: Normal breath sounds.  Chest:     Chest wall: No tenderness.  Abdominal:     General: Bowel sounds are normal.     Palpations: Abdomen is soft.     Tenderness: There is abdominal tenderness in the periumbilical area. There is no guarding or rebound. Negative signs include Murphy's sign and McBurney's sign.     Hernia: No hernia is present.  Musculoskeletal: Normal range of motion.  Skin:    General: Skin is warm and dry.     Findings: No rash.  Neurological:     Mental Status: He is alert and oriented to person, place, and time.     GCS: GCS eye subscore is 4. GCS verbal subscore is 5. GCS motor subscore is 6.     Cranial Nerves: No cranial nerve deficit.     Sensory: No  sensory deficit.     Coordination: Coordination normal.  Psychiatric:        Speech: Speech normal.        Behavior: Behavior normal.        Thought Content: Thought content normal.      ED Treatments / Results  Labs (all labs ordered are listed, but only abnormal results are displayed) Labs Reviewed  COMPREHENSIVE METABOLIC PANEL - Abnormal; Notable for the following components:      Result Value   Calcium 8.5 (*)    Albumin 3.1 (*)    AST 57 (*)    ALT 57 (*)  All other components within normal limits  CBC - Abnormal; Notable for the following components:   RBC 4.17 (*)    Platelets 417 (*)    All other components within normal limits  URINALYSIS, ROUTINE W REFLEX MICROSCOPIC - Abnormal; Notable for the following components:   Ketones, ur 5 (*)    All other components within normal limits  LIPASE, BLOOD    EKG None  Radiology No results found.  Procedures Procedures (including critical care time)  Medications Ordered in ED Medications  sodium chloride flush (NS) 0.9 % injection 3 mL (has no administration in time range)     Initial Impression / Assessment and Plan / ED Course  I have reviewed the triage vital signs and the nursing notes.  Pertinent labs & imaging results that were available during my care of the patient were reviewed by me and considered in my medical decision making (see chart for details).        Patient was awake and alert when I evaluated him.  He was sent here because he was apparently complaining of abdominal pain earlier.  When I examined him, however, he adamantly denies any abdominal pain.  This is of importance because he was seen in the ER status post cholecystectomy and he had a fluid collection.  This is commonly seen after surgery and likely was postoperative, but as there was the possibility of infection raised by radiologist, patient was started on Augmentin.  He remains afebrile.  His white count is still normal at 8.0.   When he was in the ER several days ago he was discussed with the surgeon and they did not feel that he required any further intervention, is to follow-up as an outpatient.  As his abdominal exam is benign currently, I believe this plan is still valid.  Main complaint for me was headache and dizziness.  I did plan to perform a CT scan.  Patient is currently refusing this exam.  He is grabbing onto the rail of the bed and refusing to leave the room.  I do not think that he has a serious intracranial abnormality such as subarachnoid hemorrhage based on his otherwise normal exam.  This appears to be behavioral, he started behaving this way after he could not find his fever program on the TV.  I feel that there would be more harm in sedating him to perform a CT then benefit of actually performing a CT and therefore have canceled CT.  He has been experiencing significant behavioral changes since he had his surgery.  This is the second time he was sent to the ER for this.  Patient will therefore have psych evaluation.  Final Clinical Impressions(s) / ED Diagnoses   Final diagnoses:  Agitation    ED Discharge Orders    None       Tieasha Larsen, Canary Brimhristopher J, MD 10/31/18 [phone]

## 2018-10-30 NOTE — Telephone Encounter (Signed)
Post ED Visit - Positive Culture Follow-up  Culture report reviewed by antimicrobial stewardship pharmacist: [place] Team []  Elenor Quinones, Pharm.D. []  Heide Guile, Pharm.D., BCPS AQ-ID []  Parks Neptune, Pharm.D., BCPS []  Alycia Rossetti, Pharm.D., BCPS []  Garner, Pharm.D., BCPS, AAHIVP []  Legrand Como, Pharm.D., BCPS, AAHIVP []  Salome Arnt, PharmD, BCPS []  Johnnette Gourd, PharmD, BCPS []  Hughes Better, PharmD, BCPS []  Leeroy Cha, PharmD []  Laqueta Linden, PharmD, BCPS []  Albertina Parr, PharmD  [place] Team []  Leodis Sias, PharmD []  Lindell Spar, PharmD []  Royetta Asal, PharmD []  Graylin Shiver, Rph []  Rema Fendt) Glennon Mac, PharmD []  Arlyn Dunning, PharmD []  Netta Cedars, PharmD [x]  Dia Sitter, PharmD []  Leone Haven, PharmD []  Gretta Arab, PharmD []  Theodis Shove, PharmD []  Peggyann Juba, PharmD []  Reuel Boom, PharmD   Positive urine culture Treated with Amoxicillin-Pot Clavulanate, organism sensitive to the same and no further patient follow-up is required at this time.  Larene Beach Zaley Talley 10/30/2018, 9:02 AM

## 2018-10-31 ENCOUNTER — Emergency Department (HOSPITAL_COMMUNITY): Payer: Medicare HMO

## 2018-10-31 ENCOUNTER — Encounter (HOSPITAL_COMMUNITY): Payer: Self-pay | Admitting: Registered Nurse

## 2018-10-31 DIAGNOSIS — R451 Restlessness and agitation: Secondary | ICD-10-CM | POA: Diagnosis not present

## 2018-10-31 LAB — COMPREHENSIVE METABOLIC PANEL
ALT: 57 U/L — ABNORMAL HIGH (ref 0–44)
AST: 57 U/L — ABNORMAL HIGH (ref 15–41)
Albumin: 3.1 g/dL — ABNORMAL LOW (ref 3.5–5.0)
Alkaline Phosphatase: 79 U/L (ref 38–126)
Anion gap: 11 (ref 5–15)
BUN: 14 mg/dL (ref 6–20)
CO2: 24 mmol/L (ref 22–32)
Calcium: 8.5 mg/dL — ABNORMAL LOW (ref 8.9–10.3)
Chloride: 109 mmol/L (ref 98–111)
Creatinine, Ser: 0.95 mg/dL (ref 0.61–1.24)
GFR calc Af Amer: >60 mL/min (ref >60)
GFR calc non Af Amer: >60 mL/min (ref >60)
Glucose, Bld: 90 mg/dL (ref 70–99)
Potassium: 3.8 mmol/L (ref 3.5–5.1)
Sodium: 144 mmol/L (ref 135–145)
Total Bilirubin: 0.4 mg/dL (ref 0.3–1.2)
Total Protein: 6.6 g/dL (ref 6.5–8.1)

## 2018-10-31 LAB — CBC
HCT: 40.8 % (ref 39.0–52.0)
Hemoglobin: 13.5 g/dL (ref 13.0–17.0)
MCH: 32.4 pg (ref 26.0–34.0)
MCHC: 33.1 g/dL (ref 30.0–36.0)
MCV: 97.8 fL (ref 80.0–100.0)
Platelets: 417 10*3/uL — ABNORMAL HIGH (ref 150–400)
RBC: 4.17 MIL/uL — ABNORMAL LOW (ref 4.22–5.81)
RDW: 15.2 % (ref 11.5–15.5)
WBC: 8 10*3/uL (ref 4.0–10.5)
nRBC: 0 % (ref 0.0–0.2)

## 2018-10-31 LAB — URINALYSIS, ROUTINE W REFLEX MICROSCOPIC
Bilirubin Urine: NEGATIVE
Glucose, UA: NEGATIVE mg/dL
Hgb urine dipstick: NEGATIVE
Ketones, ur: 5 mg/dL — AB
Leukocytes,Ua: NEGATIVE
Nitrite: NEGATIVE
Protein, ur: NEGATIVE mg/dL
Specific Gravity, Urine: 1.015 (ref 1.005–1.030)
pH: 6 (ref 5.0–8.0)

## 2018-10-31 LAB — LIPASE, BLOOD: Lipase: 36 U/L (ref 11–51)

## 2018-10-31 MED ORDER — COLCHICINE 0.6 MG PO TABS: 0.6000 mg | ORAL_TABLET | Freq: Every day | ORAL | Status: DC

## 2018-10-31 MED ORDER — LORATADINE 10 MG PO TABS: 10.0000 mg | ORAL_TABLET | Freq: Every day | ORAL | Status: DC

## 2018-10-31 MED ORDER — LORAZEPAM 1 MG PO TABS
1.0000 mg | ORAL_TABLET | Freq: Once | ORAL | Status: AC
  Administered 2018-10-31: 1 mg via ORAL
  Filled 2018-10-31: qty 1

## 2018-10-31 MED ORDER — CLOTRIMAZOLE 1 % EX CREA: TOPICAL_CREAM | Freq: Two times a day (BID) | CUTANEOUS | Status: DC

## 2018-10-31 MED ORDER — ALBUTEROL SULFATE HFA 108 (90 BASE) MCG/ACT IN AERS: 2.0000 | INHALATION_SPRAY | RESPIRATORY_TRACT | Status: DC | PRN

## 2018-10-31 MED ORDER — SIMVASTATIN 20 MG PO TABS
20.0000 mg | ORAL_TABLET | Freq: Every day | ORAL | Status: DC
  Administered 2018-10-31: 20 mg via ORAL
  Filled 2018-10-31: qty 1

## 2018-10-31 MED ORDER — LORAZEPAM 1 MG PO TABS: 1.0000 mg | ORAL_TABLET | Freq: Three times a day (TID) | ORAL | 0 refills | Status: DC | PRN

## 2018-10-31 MED ORDER — LORAZEPAM 2 MG/ML IJ SOLN: 1.0000 mg | Freq: Once | INTRAMUSCULAR | Status: DC

## 2018-10-31 MED ORDER — IBUPROFEN 200 MG PO TABS
600.0000 mg | ORAL_TABLET | Freq: Once | ORAL | Status: AC
  Administered 2018-10-31: 600 mg via ORAL
  Filled 2018-10-31: qty 3

## 2018-10-31 MED ORDER — ALLOPURINOL 100 MG PO TABS: 100.0000 mg | ORAL_TABLET | Freq: Every day | ORAL | Status: DC

## 2018-10-31 MED ORDER — ACETAMINOPHEN 325 MG PO TABS
650.0000 mg | ORAL_TABLET | Freq: Four times a day (QID) | ORAL | Status: DC | PRN
  Administered 2018-10-31: 650 mg via ORAL
  Filled 2018-10-31: qty 2

## 2018-10-31 MED ORDER — DOCUSATE SODIUM 100 MG PO CAPS: 100.0000 mg | ORAL_CAPSULE | Freq: Two times a day (BID) | ORAL | Status: DC

## 2018-10-31 MED ORDER — IBUPROFEN 600 MG PO TABS: 600.0000 mg | ORAL_TABLET | Freq: Four times a day (QID) | ORAL | 0 refills | Status: DC | PRN

## 2018-10-31 MED ORDER — HYDROCODONE-ACETAMINOPHEN 5-325 MG PO TABS: 1.0000 | ORAL_TABLET | Freq: Once | ORAL | Status: DC

## 2018-10-31 MED ORDER — METOPROLOL TARTRATE 25 MG PO TABS
25.0000 mg | ORAL_TABLET | Freq: Two times a day (BID) | ORAL | Status: DC
  Administered 2018-10-31: 25 mg via ORAL
  Filled 2018-10-31: qty 1

## 2018-10-31 MED ORDER — DONEPEZIL HCL 5 MG PO TABS: 5.0000 mg | ORAL_TABLET | Freq: Every day | ORAL | Status: DC

## 2018-10-31 MED ORDER — PRIMIDONE 250 MG PO TABS
250.0000 mg | ORAL_TABLET | Freq: Three times a day (TID) | ORAL | Status: DC
  Administered 2018-10-31: 250 mg via ORAL
  Filled 2018-10-31 (×2): qty 1

## 2018-10-31 MED ORDER — AMOXICILLIN-POT CLAVULANATE 875-125 MG PO TABS
1.0000 | ORAL_TABLET | Freq: Two times a day (BID) | ORAL | Status: DC
  Filled 2018-10-31: qty 1

## 2018-10-31 MED ORDER — METOPROLOL SUCCINATE ER 25 MG PO TB24: 25.0000 mg | ORAL_TABLET | Freq: Two times a day (BID) | ORAL | Status: DC

## 2018-10-31 MED ORDER — AMOXICILLIN-POT CLAVULANATE 875-125 MG PO TABS
1.0000 | ORAL_TABLET | Freq: Once | ORAL | Status: AC
  Administered 2018-10-31: 1 via ORAL
  Filled 2018-10-31: qty 1

## 2018-10-31 MED ORDER — SODIUM CHLORIDE 0.9% FLUSH: 3.0000 mL | Freq: Once | INTRAVENOUS | Status: DC

## 2018-10-31 MED ORDER — ACETAMINOPHEN 500 MG PO TABS
1000.0000 mg | ORAL_TABLET | Freq: Once | ORAL | Status: DC
  Filled 2018-10-31: qty 2

## 2018-10-31 NOTE — ED Notes (Signed)
Patient ambulated to restroom and back to room without assistance or complication. 

## 2018-10-31 NOTE — ED Notes (Signed)
ED Provider is attempting to contact Sister, Ms. Noberto Retort, to update family member and attain additional information on patient baseline.

## 2018-10-31 NOTE — ED Notes (Signed)
Went into room to attempt to give pt medication. Pt would not converse or look a Probation officer of this note. Pt still has a tight grip on the bed rail. Will attempt later to give medication.

## 2018-10-31 NOTE — ED Notes (Signed)
RN has spoken with Case Management.

## 2018-10-31 NOTE — ED Notes (Signed)
Came in at 1636, as a Air cabin crew. Pt. Responded and went right back to sleep.

## 2018-10-31 NOTE — BHH Counselor (Addendum)
Clinician spoke to [place], Camera operator, noted pt will not cooperate with the tele-assessment or via phone. Dr. Betsey Holiday is in agreement with psychiatrist assessing pt later today.   Discussed with Larose Kells, RN.   Vertell Novak, [place], [facility], [facility] Triage Specialist [phone]

## 2018-10-31 NOTE — ED Notes (Signed)
Nurse Secretary has paged TTS so RN can know what plan is for patient.

## 2018-10-31 NOTE — ED Notes (Signed)
[name] [phone] contact number to update when TTS has rounded.

## 2018-10-31 NOTE — ED Notes (Signed)
Patient is refusing to eat, take medication and speak with RN and EMT.

## 2018-10-31 NOTE — ED Notes (Signed)
Patient has been given Hot Meal at bedside.

## 2018-10-31 NOTE — ED Notes (Signed)
Spoke to sister that pt will be discharged. She gave the number to the group home to call for transport.

## 2018-10-31 NOTE — ED Notes (Signed)
Call received from pt sister [name] [phone] requesting pt status/update. Spoke w/staff...returned call to Ms Noberto Retort, advised per Ali Lowe RN/Dr [facility] currently waiting on response from requested providers re: pt care plan. Ms Noberto Retort requested to notify staff possibility of pt having thrush. Advised Ms Noberto Retort staff will contact once updates received; welcomed rtn call to pt/staff as necessary. Royce RN advised. [employer]

## 2018-10-31 NOTE — ED Notes (Signed)
Pt is very pleasant. Answering all questions, telling me about his day and his favorite TV show. Pt wants to watch television, and requested a warm blanket. Pt was given a warm blanket and stated he just wanted to rest. Will continue to monitor

## 2018-10-31 NOTE — ED Notes (Addendum)
Pt is pleasant, sitting on the edge of the bed eating his supper. Pt denies pain and states that he had a good day and slept well last night. Pt request dessert and another drink. Pt was given ice cream and ginger ale.

## 2018-10-31 NOTE — BH Assessment (Signed)
[place] Assessment Progress Note Patient was seen earlier this date by this writer and Akintayo MD. Patient was non verbal and would not participate in the assessment. This Probation officer will attempt to assess later this date.

## 2018-10-31 NOTE — ED Notes (Signed)
Pt reports that he has pain all over, notified provider. Took pain medication into pts room, where he took a sip of apple juice. Pt then turned over and refused to take medication. Will attempt to give medications later, will continue to monitor.

## 2018-10-31 NOTE — ED Notes (Signed)
CT attempted to move pt from stretcher to CT scan, pt would not let go of bed. Pt would not look or interact with staff when explaining need for scan. Pt was transported back to the room.  Pt will not open eyes, will only react to painful stimuli. Provider notified and went to bedside.

## 2018-10-31 NOTE — ED Notes (Addendum)
ED Provider Kohut at bedside. 

## 2018-10-31 NOTE — ED Provider Notes (Signed)
I assessed patient. Obvious intellectual limitations but he is talking with me. He says he would take his medications and asked me if I had any tylenol I could give him. He reportedly has been eating and ambulating to the bathroom. His w/u has been fairly unremarkable. Will give him his meds. If he takes them then will contact his group home as I do not see any further reason to keep him here in the ED.   5:08 PM I spoke with Gary Neal, Gary Neal.  He has had difficult behavior since his recent hospitalization/surgery. She suspects he has been having discomfort and generally doesn't comprehend what has happened to him. He isn't capable of verbalizing how he is feeling very well. This leads to him being frustrated and will act out in varying ways such as throwing things, refusing medications, refusing to talk or cooperate, etc.   This seems like a very reasonable explanation to me. He was only discharged with tylenol for pain after his surgery. At this point he probably doesn't need much more but, in a patient that is better able to verbalize their symptoms, he would have likely been prescribed more than this.   I discussed with his Neal that he is now eating, taking meds and otherwise reasonably cooperative. From a medical standpoint, there doesn't seem to be much acutely that needs to me done. His Neal inquired if there is any medications we can proscribe that may help with his behavior or make him more comfortable.   I think this is reasonable to try. Ibuprofen would likely treat his pain better than tylenol and I don't mind prescribing PRN ativan for a week.    Virgel Manifold, MD 10/31/18 [phone]

## 2018-10-31 NOTE — ED Notes (Addendum)
Called Gary Neal at the pts group home to notify her that the pt would be discharged and be brought back tonight. Over the phone Gary expressed discontentment with him coming back to the group home. Stating "I just wanted one night of rest, just send him back but I will just send him back to yall, he will be coming back."   Writer of this note explained that the pt was seen by TTS and Psychiatrist today in the ED and cleared by ED MD.

## 2018-10-31 NOTE — Consult Note (Addendum)
  Tele psychic machine and ipad are not operation in the [facility]; Unable to complete psychiatric assessment via telephone related to the patient not responding verbally.   Will attempt assessment at later date.  Patient must be able to respond verbally weather it is by telephone or tele psychic machine in order for the assessment to be completed.   Gary B. Rankin, NP Patient seen face-to-face for psychiatric evaluation, chart reviewed and case discussed with the physician extender and developed treatment plan. Reviewed the information documented and agree with the treatment plan. Corena Pilgrim, MD

## 2018-10-31 NOTE — ED Notes (Signed)
Patient is now awake and alert eating at bedside.

## 2018-11-01 ENCOUNTER — Emergency Department (HOSPITAL_COMMUNITY)
Admission: EM | Admit: 2018-11-01 | Discharge: 2018-11-03 | Disposition: A | Discharge status: Home / Self Care | Payer: Medicare HMO | Attending: Emergency Medicine | Admitting: Emergency Medicine

## 2018-11-01 ENCOUNTER — Encounter (HOSPITAL_COMMUNITY): Payer: Self-pay | Admitting: Emergency Medicine

## 2018-11-01 ENCOUNTER — Telehealth: Payer: Self-pay | Admitting: *Deleted

## 2018-11-01 ENCOUNTER — Other Ambulatory Visit: Payer: Self-pay

## 2018-11-01 DIAGNOSIS — I10 Essential (primary) hypertension: Secondary | ICD-10-CM | POA: Insufficient documentation

## 2018-11-01 DIAGNOSIS — R45851 Suicidal ideations: Secondary | ICD-10-CM | POA: Insufficient documentation

## 2018-11-01 DIAGNOSIS — Z20828 Contact with and (suspected) exposure to other viral communicable diseases: Secondary | ICD-10-CM | POA: Insufficient documentation

## 2018-11-01 DIAGNOSIS — F79 Unspecified intellectual disabilities: Secondary | ICD-10-CM | POA: Diagnosis not present

## 2018-11-01 DIAGNOSIS — F331 Major depressive disorder, recurrent, moderate: Secondary | ICD-10-CM | POA: Diagnosis not present

## 2018-11-01 DIAGNOSIS — R456 Violent behavior: Secondary | ICD-10-CM | POA: Diagnosis present

## 2018-11-01 HISTORY — DX: Mental disorder, not otherwise specified: F99

## 2018-11-01 LAB — COMPREHENSIVE METABOLIC PANEL
ALT: 91 U/L — ABNORMAL HIGH (ref 0–44)
AST: 81 U/L — ABNORMAL HIGH (ref 15–41)
Albumin: 3.2 g/dL — ABNORMAL LOW (ref 3.5–5.0)
Alkaline Phosphatase: 86 U/L (ref 38–126)
Anion gap: 12 (ref 5–15)
BUN: 14 mg/dL (ref 6–20)
CO2: 22 mmol/L (ref 22–32)
Calcium: 8.9 mg/dL (ref 8.9–10.3)
Chloride: 106 mmol/L (ref 98–111)
Creatinine, Ser: 1.23 mg/dL (ref 0.61–1.24)
GFR calc Af Amer: >60 mL/min (ref >60)
GFR calc non Af Amer: >60 mL/min (ref >60)
Glucose, Bld: 108 mg/dL — ABNORMAL HIGH (ref 70–99)
Potassium: 4 mmol/L (ref 3.5–5.1)
Sodium: 140 mmol/L (ref 135–145)
Total Bilirubin: 0.6 mg/dL (ref 0.3–1.2)
Total Protein: 7 g/dL (ref 6.5–8.1)

## 2018-11-01 LAB — CBC
HCT: 43.1 % (ref 39.0–52.0)
Hemoglobin: 14.4 g/dL (ref 13.0–17.0)
MCH: 32 pg (ref 26.0–34.0)
MCHC: 33.4 g/dL (ref 30.0–36.0)
MCV: 95.8 fL (ref 80.0–100.0)
Platelets: 540 10*3/uL — ABNORMAL HIGH (ref 150–400)
RBC: 4.5 MIL/uL (ref 4.22–5.81)
RDW: 14.7 % (ref 11.5–15.5)
WBC: 7.6 10*3/uL (ref 4.0–10.5)
nRBC: 0 % (ref 0.0–0.2)

## 2018-11-01 LAB — ETHANOL: Alcohol, Ethyl (B): <10 mg/dL (ref <10)

## 2018-11-01 LAB — SALICYLATE LEVEL: Salicylate Lvl: <7 mg/dL (ref 2.8–30.0)

## 2018-11-01 LAB — ACETAMINOPHEN LEVEL: Acetaminophen (Tylenol), Serum: <10 ug/mL — ABNORMAL LOW (ref 10–30)

## 2018-11-01 MED ORDER — PRIMIDONE 250 MG PO TABS
250.0000 mg | ORAL_TABLET | Freq: Three times a day (TID) | ORAL | Status: DC
  Administered 2018-11-02 – 2018-11-03 (×3): 250 mg via ORAL
  Filled 2018-11-01 (×7): qty 1

## 2018-11-01 MED ORDER — METOPROLOL TARTRATE 25 MG PO TABS
25.0000 mg | ORAL_TABLET | Freq: Two times a day (BID) | ORAL | Status: DC
  Administered 2018-11-02 – 2018-11-03 (×3): 25 mg via ORAL
  Filled 2018-11-01 (×4): qty 1

## 2018-11-01 MED ORDER — DOCUSATE SODIUM 100 MG PO CAPS
100.0000 mg | ORAL_CAPSULE | Freq: Two times a day (BID) | ORAL | Status: DC
  Administered 2018-11-02 – 2018-11-03 (×3): 100 mg via ORAL
  Filled 2018-11-01 (×4): qty 1

## 2018-11-01 MED ORDER — LORATADINE 10 MG PO TABS
10.0000 mg | ORAL_TABLET | Freq: Every day | ORAL | Status: DC
  Administered 2018-11-01 – 2018-11-03 (×3): 10 mg via ORAL
  Filled 2018-11-01 (×3): qty 1

## 2018-11-01 MED ORDER — LORAZEPAM 1 MG PO TABS
1.0000 mg | ORAL_TABLET | Freq: Three times a day (TID) | ORAL | Status: DC | PRN
  Administered 2018-11-02: 1 mg via ORAL
  Filled 2018-11-01 (×2): qty 1

## 2018-11-01 MED ORDER — CLOTRIMAZOLE 1 % EX CREA
TOPICAL_CREAM | Freq: Two times a day (BID) | CUTANEOUS | Status: DC
  Administered 2018-11-02: 11:00:00 via TOPICAL
  Filled 2018-11-01: qty 15

## 2018-11-01 MED ORDER — ALUM & MAG HYDROXIDE-SIMETH 200-200-20 MG/5ML PO SUSP
30.0000 mL | Freq: Four times a day (QID) | ORAL | Status: DC | PRN
  Administered 2018-11-03: 30 mL via ORAL
  Filled 2018-11-01: qty 30

## 2018-11-01 MED ORDER — AMOXICILLIN-POT CLAVULANATE 875-125 MG PO TABS
1.0000 | ORAL_TABLET | Freq: Two times a day (BID) | ORAL | Status: DC
  Administered 2018-11-01 – 2018-11-03 (×4): 1 via ORAL
  Filled 2018-11-01 (×4): qty 1

## 2018-11-01 MED ORDER — ACETAMINOPHEN 325 MG PO TABS
650.0000 mg | ORAL_TABLET | ORAL | Status: DC | PRN
  Administered 2018-11-02: 650 mg via ORAL
  Filled 2018-11-01: qty 2

## 2018-11-01 MED ORDER — SIMVASTATIN 20 MG PO TABS
20.0000 mg | ORAL_TABLET | Freq: Every day | ORAL | Status: DC
  Administered 2018-11-01 – 2018-11-03 (×3): 20 mg via ORAL
  Filled 2018-11-01 (×3): qty 1

## 2018-11-01 MED ORDER — ONDANSETRON HCL 4 MG PO TABS: 8.0000 mg | ORAL_TABLET | ORAL | Status: DC | PRN

## 2018-11-01 NOTE — ED Notes (Signed)
Patient wanted by security

## 2018-11-01 NOTE — Telephone Encounter (Signed)
EDCM noted CM consult.  Reviewed chart and basis for consult are unclear as the CM consult order is incomplete and the EDP discharge note does not mention CM instructions.    

## 2018-11-01 NOTE — ED Notes (Signed)
Personal items placed in locker 3 

## 2018-11-01 NOTE — BH Assessment (Signed)
Tele Assessment Note   Patient Name: Gary Neal MRN: [id number] Referring Physician: Dr. Charlesetta Shanks, MD Location of Patient: [facility] Location of Provider: [facility]  Gary Neal is a 58 y.o. male IVC'd by his group home [facility] due to aggressive behavior.  Pt states "I just want to beat my head against the wall and kill myself. I want to go live with Jesus, my Lord and [employer] and my momma."  Pt denies HI but states "I'm tired of the people at my group home they getting on my nerves I just want to be alone.  Pt denies SA/A/V-hallucinations.   Pt has resided at [facility] for 30 years.  [name] [phone] is the owner. According to IVC paperwork the patient became agitated today started throwing things at his roommate.  He states that his roommate was getting on his nerves and irritating him.       Family Collateral [name], Sister [phone])  According to pt's sister, Pt is usually a very pleasant and sweet person.  On 10/19/2018 pt had emergency Gall Bladder surgery and I think pt is still having pain from his surgery.  Prior to the surgery pt was visiting at my house and pt was complaining about his side hurting but he said he was okay.  When I took pt back to the group home ( [facility]) he started complaining more they called EMS.  The surgery was performed that day and pt was sent home with no medicine. Pt went to visit our brother the other day and started having some.  Pt is not himself he had a seizure the other day and used the bathroom on himself which something he has never done.   Disposition: [facility] discussed case with [place] provider, Marvia Pickles, NP who recommended inpatient treatment.  Diagnosis: F33.1 Major Depressive Disorder; Severe, Single episode.  Past Medical History:  Past Medical History:  Diagnosis Date  . Mental disorder   . Seizures ([place])     Past Surgical History:   Procedure Laterality Date  . CHOLECYSTECTOMY N/A 10/20/2018   Procedure: LAPAROSCOPIC CHOLECYSTECTOMY;  Surgeon: [name], MD;  Location: [facility];  Service: General;  Laterality: N/A;    Family History: No family history on file.  Social History:  reports that he has never smoked. He has never used smokeless tobacco. No history on file for alcohol and drug.  Additional Social History:  Alcohol / Drug Use Pain Medications: See MARs Prescriptions: See MARs Over the Counter: See MARs History of alcohol / drug use?: No history of alcohol / drug abuse  CIWA: CIWA-Ar BP: 112/61 Pulse Rate: (!) 102 COWS:    Allergies: No Known Allergies  Home Medications: (Not in a hospital admission)   OB/GYN Status:  No LMP for male patient.  General Assessment Data Location of Assessment: [facility] ED TTS Assessment: In system Is this a Tele or Face-to-Face Assessment?: Tele Assessment Is this an Initial Assessment or a Re-assessment for this encounter?: Initial Assessment Patient Accompanied by:: N/A Language Other than English: No Living Arrangements: In Group Home: (Comment: Name of Group Home)([facility]) What gender do you identify as?: Male Marital status: Single Living Arrangements: Group Home([facility]) Can pt return to current living arrangement?: Yes Admission Status: Involuntary Petitioner: Other Is patient capable of signing voluntary admission?: No Referral Source: Other([name])     Crisis Care Plan Living Arrangements: Group Home([facility])  Risk to self with the past 6 months Suicidal Ideation: Yes-Currently Present Has patient been a risk to self within the past 6 months prior to admission? : No Suicidal Intent: Yes-Currently Present Has patient had any suicidal intent within the past 6 months prior to admission? : No Is patient at risk for suicide?: No Suicidal Plan?: Yes-Currently Present Has patient had any suicidal plan  within the past 6 months prior to admission? : No Specify Current Suicidal Plan: Brennan BaileyBang my head against the wall Access to Means: Yes Specify Access to Suicidal Means: wall Previous Attempts/Gestures: No Triggers for Past Attempts: None known Intentional Self Injurious Behavior: None Family Suicide History: Unknown Recent stressful life event(s): Other (Comment)(Gall Bladder Surgery 10/19/2018) Persecutory voices/beliefs?: No Depression: No Depression Symptoms: Isolating Substance abuse history and/or treatment for substance abuse?: No Suicide prevention information given to non-admitted patients: Not applicable  Risk to Others within the past 6 months Homicidal Ideation: No Does patient have any lifetime risk of violence toward others beyond the six months prior to admission? : No Thoughts of Harm to Others: No Current Homicidal Intent: No Current Homicidal Plan: No Access to Homicidal Means: No History of harm to others?: No Assessment of Violence: None Noted Does patient have access to weapons?: No Criminal Charges Pending?: No Does patient have a court date: No Is patient on probation?: No  Psychosis Hallucinations: None noted Delusions: None noted  Mental Status Report Appearance/Hygiene: In scrubs Eye Contact: Good Motor Activity: Freedom of movement Speech: Rapid Level of Consciousness: Alert Mood: Pleasant Affect: Unable to Assess Anxiety Level: None Thought Processes: Unable to Assess Judgement: Unable to Assess Orientation: Person Obsessive Compulsive Thoughts/Behaviors: None  Cognitive Functioning Concentration: Unable to Assess Memory: Unable to Assess Is patient IDD: Yes(Per sister [name]) Level of Function: Can take care of ADL but not live alone Is IQ score available?: No Insight: Unable to Assess Impulse Control: Unable to Assess Appetite: Poor Have you had any weight changes? : No Change Sleep: Unable to Assess Total Hours of Sleep:  5(staying up at night per sister) Vegetative Symptoms: Unable to Assess  ADLScreening [facility] Assessment Services) Patient's cognitive ability adequate to safely complete daily activities?: Yes Patient able to express need for assistance with ADLs?: Yes Independently performs ADLs?: Yes (appropriate for developmental age)  Prior Inpatient Therapy Prior Inpatient Therapy: No  Prior Outpatient Therapy Prior Outpatient Therapy: No Does patient have an ACCT team?: No Does patient have Intensive In-House Services?  : No Does patient have [facility] services? : No Does patient have P4CC services?: No  ADL Screening (condition at time of admission) Patient's cognitive ability adequate to safely complete daily activities?: Yes Is the patient deaf or have difficulty hearing?: No Does the patient have difficulty seeing, even when wearing glasses/contacts?: No Does the patient have difficulty concentrating, remembering, or making decisions?: Yes Patient able to express need for assistance with ADLs?: Yes Does the patient have difficulty dressing or bathing?: No Independently performs ADLs?: Yes (appropriate for developmental age) Communication: Independent Dressing (OT): Independent Grooming: Independent Feeding: Independent Bathing: Independent Toileting: Independent In/Out Bed: Independent Walks in Home: Independent Does the patient have difficulty walking or climbing stairs?: No Weakness of Legs: None Weakness of Arms/Hands: None  Home Assistive Devices/Equipment Home Assistive Devices/Equipment: None    Abuse/Neglect Assessment (Assessment to be complete while patient is alone) Abuse/Neglect Assessment Can Be Completed: Unable to assess, patient is non-responsive or altered mental status     Advance Directives (For Healthcare) Does Patient  Have a Medical Advance Directive?: No Would patient like information on creating a medical advance directive?: No - Patient declined Nutrition  Screen- [facility] Adult/[facility] Patient's home diet: NPO        Disposition: [facility] discussed case with BH provider, Reola Calkinsravis Money, NP who recommended inpatient treatment.  Disposition Initial Assessment Completed for this Encounter: Yes(Per Reola Calkinsravis Money, NP) Disposition of Patient: Admit Type of inpatient treatment program: Adult Patient refused recommended treatment: No  This service was provided via telemedicine using a 2-way, interactive audio and video technology.  Names of all persons participating in this telemedicine service and their role in this encounter. Name: Julieta GuttingJerry A. Blancett Role: Patient  Name: [name] Role: Sister  Name: Tyron Russellhristel L Alexis Reber, MS, [facility], [facility] Role: Therapist  Name: Reola Calkinsravis Money, NP Role: [facility] Provider, NP    Philicia Heyne Thayer DallasL Najia Hurlbutt, MS, [facility], [facility] 11/01/2018 6:33 PM

## 2018-11-01 NOTE — ED Notes (Signed)
Pt to room 49. Pt calm , cooperative  Guarded.  Pt in bed resting. Sitter at bedside.

## 2018-11-01 NOTE — ED Notes (Signed)
Patient up to use the bathroom.

## 2018-11-01 NOTE — ED Triage Notes (Addendum)
Pt arrives via [facility] [facility] group home after having an episode where he has become  Combative and  And throwing things at group home, pt has not  Had any meds since his surgery  , pt has disability  Pt IVC  Per group home

## 2018-11-01 NOTE — ED Provider Notes (Signed)
[facility] Provider Note   CSN: [id number] Arrival date & time: 11/01/18  1511     History   Chief Complaint Chief Complaint  Patient presents with  . Medical Clearance    HPI Darlis LoanJerry A Frieden is a 58 y.o. male sent in from his group under under involuntary commitment.  The patient has had multiple visits for behavioral issues recently.  He was discharged yesterday after psych evaluation and review of EMR shows that nursing staff communicated with his group home who stated "I just wanted one night of rest just send him back, but I will just send him back to y'all." According to IVC paperwork the patient became agitated today started throwing things at his roommate.  He states that his roommate was getting on his nerves and irritating him.  Nursing triage note also states that the patient has not been taking his medicine since he had a cholecystectomy 2 weeks ago.  Patient complains of some mild back pain and asked for Tylenol.  He denies abdominal pain.  There is a level 5 caveat due to the patient's cognitive disabilities.     HPI  Past Medical History:  Diagnosis Date  . Mental disorder   . Seizures [facility](HCC)     Patient Active Problem List   Diagnosis Date Noted  . Sepsis (HCC) secondary to acute cholecystitis 10/20/2018  . Pleural effusion on right 10/20/2018  . Hyponatremia 10/20/2018  . AKI (acute kidney injury) (HCC) 10/20/2018  . Hyperlipidemia 10/20/2018  . Essential hypertension 10/20/2018  . Mental disability 10/20/2018  . Acute cholecystitis 10/19/2018    Past Surgical History:  Procedure Laterality Date  . CHOLECYSTECTOMY N/A 10/20/2018   Procedure: LAPAROSCOPIC CHOLECYSTECTOMY;  Surgeon: Axel Filleramirez, Armando, MD;  Location: [facility] ORS;  Service: General;  Laterality: N/A;        Home Medications    Prior to Admission medications   Medication Sig Start Date End Date Taking? Authorizing Provider  acetaminophen (TYLENOL) 325 MG  tablet Take 650 mg by mouth every 6 (six) hours as needed for mild pain or headache.    [provider]  amoxicillin-clavulanate (AUGMENTIN) 875-125 MG tablet Take 1 tablet by mouth every 12 (twelve) hours. 10/26/18   Petrucelli, Samantha R, PA-C  cetirizine (ZYRTEC) 10 MG tablet Take 10 mg by mouth daily.    [provider]  clotrimazole (LOTRIMIN) 1 % cream Apply to affected area (glans penis/foreskin) 2 times daily 10/26/18   Petrucelli, Lelon MastSamantha R, PA-C  Docusate Sodium (STOOL SOFTENER) 100 MG capsule Take 100 mg by mouth 2 (two) times daily.    [provider]  ergocalciferol (VITAMIN D2) 1.25 MG (50000 UT) capsule Take 50,000 Units by mouth every Thursday.     [provider]  ibuprofen (ADVIL) 600 MG tablet Take 1 tablet (600 mg total) by mouth every 6 (six) hours as needed. 10/31/18   Raeford RazorKohut, Stephen, MD  LORazepam (ATIVAN) 1 MG tablet Take 1 tablet (1 mg total) by mouth every 8 (eight) hours as needed (agitation). 10/31/18   Raeford RazorKohut, Stephen, MD  metoprolol tartrate (LOPRESSOR) 25 MG tablet Take 25 mg by mouth 2 (two) times daily.  10/20/18   [provider]  ondansetron (ZOFRAN) 4 MG tablet Take 2 tablets (8 mg total) by mouth every 4 (four) hours as needed for nausea or vomiting. 10/05/16   Isa RankinMurray, Laura Wilson, MD  oxyCODONE-acetaminophen (PERCOCET/ROXICET) 5-325 MG tablet Take 1-2 tablets by mouth every 6 (six) hours as needed for  severe pain. 10/26/18   Petrucelli, Samantha R, PA-C  primidone (MYSOLINE) 250 MG tablet Take 250 mg by mouth 3 (three) times daily.     [provider]  simvastatin (ZOCOR) 20 MG tablet Take 20 mg by mouth daily.    [provider]    Family History No family history on file.  Social History Social History   Tobacco Use  . Smoking status: Never Smoker  . Smokeless tobacco: Never Used  Substance Use Topics  . Alcohol use: Not on file  . Drug use: Not on file     Allergies   Patient has no known  allergies.   Review of Systems Review of Systems Ten systems reviewed and are negative for acute change, except as noted in the HPI.    Physical Exam Updated Vital Signs BP 112/61 (BP Location: Right Arm)   Pulse (!) 102   Temp 98.2 F (36.8 C) (Oral)   Resp 16   SpO2 98%   Physical Exam Vitals signs and nursing note reviewed.  Constitutional:      General: He is not in acute distress.    Appearance: He is well-developed. He is not diaphoretic.  HENT:     Head: Normocephalic and atraumatic.  Eyes:     General: No scleral icterus.    Conjunctiva/sclera: Conjunctivae normal.  Neck:     Musculoskeletal: Normal range of motion and neck supple.  Cardiovascular:     Rate and Rhythm: Normal rate and regular rhythm.     Heart sounds: Normal heart sounds.  Pulmonary:     Effort: Pulmonary effort is normal. No respiratory distress.     Breath sounds: Normal breath sounds.  Abdominal:     Palpations: Abdomen is soft.     Tenderness: There is no abdominal tenderness.     Comments: Mildly tender over trocar sites which appear well-healing  Skin:    General: Skin is warm and dry.  Neurological:     Mental Status: He is alert.  Psychiatric:        Behavior: Behavior normal.      ED Treatments / Results  Labs (all labs ordered are listed, but only abnormal results are displayed) Labs Reviewed  COMPREHENSIVE METABOLIC PANEL - Abnormal; Notable for the following components:      Result Value   Glucose, Bld 108 (*)    Albumin 3.2 (*)    AST 81 (*)    ALT 91 (*)    All other components within normal limits  ACETAMINOPHEN LEVEL - Abnormal; Notable for the following components:   Acetaminophen (Tylenol), Serum <10 (*)    All other components within normal limits  CBC - Abnormal; Notable for the following components:   Platelets 540 (*)    All other components within normal limits  ETHANOL  SALICYLATE LEVEL  RAPID URINE DRUG SCREEN, HOSP PERFORMED    EKG     Radiology No results found.  Procedures Procedures (including critical care time)  Medications Ordered in ED Medications  amoxicillin-clavulanate (AUGMENTIN) 875-125 MG per tablet 1 tablet (has no administration in time range)  loratadine (CLARITIN) tablet 10 mg (has no administration in time range)  clotrimazole (LOTRIMIN) 1 % cream (has no administration in time range)  docusate sodium (COLACE) capsule 100 mg (has no administration in time range)  LORazepam (ATIVAN) tablet 1 mg (has no administration in time range)  metoprolol tartrate (LOPRESSOR) tablet 25 mg (has no administration in time range)  ondansetron (ZOFRAN) tablet 8  mg (has no administration in time range)  primidone (MYSOLINE) tablet 250 mg (has no administration in time range)  simvastatin (ZOCOR) tablet 20 mg (has no administration in time range)  acetaminophen (TYLENOL) tablet 650 mg (has no administration in time range)  alum & mag hydroxide-simeth (MAALOX/MYLANTA) 200-200-20 MG/5ML suspension 30 mL (has no administration in time range)     Initial Impression / Assessment and Plan / ED Course  I have reviewed the triage vital signs and the nursing notes.  Pertinent labs & imaging results that were available during my care of the patient were reviewed by me and considered in my medical decision making (see chart for details).  Clinical Course as of Oct 31 1733  Sun Nov 01, 2018  1735 AST(!): 81 [AH]  1735 ALT(!): 91 [AH]    Clinical Course User Index [AH] Arthor CaptainHarris, Mayia Megill, PA-C       58 year old male under involuntary commitment from his group home. The patient appears to be medically clear for psych eval I doubt a medical cause of his behavioral changes.  I discussed case with nurse practitioner Reola Calkinsravis Money of the  Final Clinical Impressions(s) / ED Diagnoses   Final diagnoses:  None    ED Discharge Orders    None       Arthor CaptainHarris, Kade Demicco, PA-C 11/02/18 1827    Arby BarrettePfeiffer, Marcy, MD 11/02/18 [phone]

## 2018-11-01 NOTE — ED Notes (Signed)
Contact Cindy (sister-in-law) at [phone]

## 2018-11-01 NOTE — ED Notes (Signed)
Patient denies pain and is resting comfortably.  

## 2018-11-02 DIAGNOSIS — F331 Major depressive disorder, recurrent, moderate: Secondary | ICD-10-CM | POA: Diagnosis not present

## 2018-11-02 LAB — SARS CORONAVIRUS 2: SARS Coronavirus 2: NOT DETECTED

## 2018-11-02 NOTE — Progress Notes (Signed)
Patient's sister, Cheri Rous, and her husband, Kemper Durie, will pick patient up from ED tomorrow at [facility].  CSW received handoff from [facility] ED CSW regarding patient refusing to go home. CSW received call from ED RN regarding patient plan to return and noted per RN after patient spoke with his sister on the phone he was much more pleasant and reported to RN he would go back to the group home. CSW discussed taxi voucher and noted RN's concern with patient's condition in taking a taxi cab back to the group home.  CSW spoke with group home owner regarding picking patient up tonight. CSW noted per group home owner she had no staff tonight and had 4 residents she was caring for tonight. CSW gathered collateral on patient's history from her and noted patient did have a similar episode of this 10+ years ago with another medical condition although does not remember.  CSW spoke with patient's sister for further collateral. Per patient's sister patient has done well at the group home since he has resided there the past 30 years. CSW notes patient does not have regularly episodes but had acted the same when he was sick with strep throat 6-7 years ago. CSW was informed patient had a seizure on 10/27/2018 at his brother's home and refused a CT scan when he returned to [facility] ED for that.   CSW spoke with patient and noted patient reported "I had some bad times, and a lot of good times there. Right now I just.. ." Patient did not finish statement. CSW asked regarding pain and noted patient reported having a lot of pain in his stomach "every time I move."   CSW informed ED RN of report of pain and seizure. ED RN informed CSW patient has not had any follow-up testing for seizure on 10/27/2018 and per family it was his first seizure in 23 years. CSW informed attending MD of report of pain and seizure. CSW was informed by family that his sister will pick him up at noon tomorrow.  Lamonte Richer, LCSW, [place]  [facility] [phone]

## 2018-11-02 NOTE — ED Notes (Signed)
Breakfast try ordered

## 2018-11-02 NOTE — ED Notes (Signed)
Breakfast ordered 

## 2018-11-02 NOTE — ED Notes (Signed)
Pt refusing to get up to be discharged, security at bedside

## 2018-11-02 NOTE — Consult Note (Signed)
Telepsych Consultation   Reason for Consult:  combative and aggressive behavior Referring Physician:  EPD Location of Patient: 049C Location of Provider: [facility]  Patient Identification: Gary LoanJerry A Neal MRN:  [id number] Principal Diagnosis: <principal problem not specified> Diagnosis:  Active Problems:   * No active hospital problems. *   Total Time spent with patient: 15 minutes  Subjective:   Gary Neal is a 58 y.o. male  Patient seen via tele assessment. He presented flat, guarded but pleasant. Chart reviewed patient medical history of mental disability. Gary SorrowJerry denied thoughts of wanting to hurt self or others. Patient denied auditory or visual hallucinations. Patient reports " I see black spots." however the lights was just turned on during this assessment. tts to obtain collateral. Patient to discharge back to [facility] and will provided additional outpatient resources to include Respite care . Case staffed with MD Lucianne MussKumar. Support, encouragement  and reassurance was provided.   HPI:  Per assessment note: Gary LoanJerry A Neal is a 58 y.o. male IVC'd by his [facility] due to aggressive behavior.  Pt states "I just want to beat my head against the wall and kill myself. I want to go live with Jesus, my Lord and H[employer] FoodsSavior and my momma."  Pt denies HI but states "I'm tired of the people at my [facility] they getting on my nerves I just want to be alone.  Pt denies SA/A/V-hallucinations.   [facility]  Gary ForsterLatha Neal [phone] is the owner. According to IVC paperwork the patient became agitated today started throwing things at his roommate. He states that his roommate was getting on his nerves and irritating him.      Family Collateral Gary KiefLisha Neal, Sister [phone])  According to pt's sister, Pt is usually a very pleasant and sweet person.  On 10/19/2018 pt had emergency Gall Bladder surgery and I think pt is still having  pain from his surgery.  Prior to the surgery pt was visiting at my house and pt was complaining about his side hurting but he said he was okay.  When I took pt back to the [facility] ( [facility]) he started complaining more they called EMS.  The surgery was performed that day and pt was sent home with no medicine. Pt went to visit our brother the other day and started having some.  Pt is not himself he had a seizure   Past Psychiatric History:  Risk to Self: Suicidal Ideation: Yes-Currently Present Suicidal Intent: Yes-Currently Present Is patient at risk for suicide?: No Suicidal Plan?: Yes-Currently Present Specify Current Suicidal Plan: Bang my head against the wall Access to Means: Yes Specify Access to Suicidal Means: wall Triggers for Past Attempts: None known Intentional Self Injurious Behavior: None Risk to Others: Homicidal Ideation: No Thoughts of Harm to Others: No Current Homicidal Intent: No Current Homicidal Plan: No Access to Homicidal Means: No History of harm to others?: No Assessment of Violence: None Noted Does patient have access to weapons?: No Criminal Charges Pending?: No Does patient have a court date: No Prior Inpatient Therapy: Prior Inpatient Therapy: No Prior Outpatient Therapy: Prior Outpatient Therapy: No Does patient have an ACCT team?: No Does patient have Intensive In-House Services?  : No Does patient have [facility] services? : No Does patient have P4CC services?: No  Past Medical History:  Past Medical History:  Diagnosis Date  . Mental disorder   . Seizures (HCC)  Past Surgical History:  Procedure Laterality Date  . CHOLECYSTECTOMY N/A 10/20/2018   Procedure: LAPAROSCOPIC CHOLECYSTECTOMY;  Surgeon: Axel Filleramirez, Armando, MD;  Location: [facility] ORS;  Service: General;  Laterality: N/A;   Family History: No family history on file. Family Psychiatric  History:  Social History:  Social History   Substance and Sexual Activity  Alcohol Use  None     Social History   Substance and Sexual Activity  Drug Use Not on file    Social History   Socioeconomic History  . Marital status: Married    Spouse name: Not on file  . Number of children: Not on file  . Years of education: Not on file  . Highest education level: Not on file  Occupational History  . Not on file  Social Needs  . Financial resource strain: Not on file  . Food insecurity    Worry: Not on file    Inability: Not on file  . Transportation needs    Medical: Not on file    Non-medical: Not on file  Tobacco Use  . Smoking status: Never Smoker  . Smokeless tobacco: Never Used  Substance and Sexual Activity  . Alcohol use: Not on file  . Drug use: Not on file  . Sexual activity: Not on file  Lifestyle  . Physical activity    Days per week: Not on file    Minutes per session: Not on file  . Stress: Not on file  Relationships  . Social Musicianconnections    Talks on phone: Not on file    Gets together: Not on file    Attends religious service: Not on file    Active member of club or organization: Not on file    Attends meetings of clubs or organizations: Not on file    Relationship status: Not on file  Other Topics Concern  . Not on file  Social History Narrative  . Not on file   Additional Social History:    Allergies:  No Known Allergies  Labs:  Results for orders placed or performed during the hospital encounter of 11/01/18 (from the past 48 hour(s))  Comprehensive metabolic panel     Status: Abnormal   Collection Time: 11/01/18  3:25 PM  Result Value Ref Range   Sodium 140 135 - 145 mmol/L   Potassium 4.0 3.5 - 5.1 mmol/L   Chloride 106 98 - 111 mmol/L   CO2 22 22 - 32 mmol/L   Glucose, Bld 108 (H) 70 - 99 mg/dL   BUN 14 6 - 20 mg/dL   Creatinine, Ser 4.091.23 0.61 - 1.24 mg/dL   Calcium 8.9 8.9 - 81.110.3 mg/dL   Total Protein 7.0 6.5 - 8.1 g/dL   Albumin 3.2 (L) 3.5 - 5.0 g/dL   AST 81 (H) 15 - 41 U/L   ALT 91 (H) 0 - 44 U/L   Alkaline  Phosphatase 86 38 - 126 U/L   Total Bilirubin 0.6 0.3 - 1.2 mg/dL   GFR calc non Af Amer >60 >60 mL/min   GFR calc Af Amer >60 >60 mL/min   Anion gap 12 5 - 15    Comment: Performed at [facility], [address] [place], [place] [address]  Ethanol     Status: None   Collection Time: 11/01/18  3:25 PM  Result Value Ref Range   Alcohol, Ethyl (B) <10 <10 mg/dL    Comment: (NOTE) Lowest detectable limit for serum alcohol is 10 mg/dL. For  medical purposes only. Performed at [facility], [address] [place], [place] [address]   Salicylate level     Status: None   Collection Time: 11/01/18  3:25 PM  Result Value Ref Range   Salicylate Lvl <7.0 2.8 - 30.0 mg/dL    Comment: Performed at [facility], [address] [place], [place] [address]  Acetaminophen level     Status: Abnormal   Collection Time: 11/01/18  3:25 PM  Result Value Ref Range   Acetaminophen (Tylenol), Serum <10 (L) 10 - 30 ug/mL    Comment: (NOTE) Therapeutic concentrations vary significantly. A range of 10-30 ug/mL  may be an effective concentration for many patients. However, some  are best treated at concentrations outside of this range. Acetaminophen concentrations >150 ug/mL at 4 hours after ingestion  and >50 ug/mL at 12 hours after ingestion are often associated with  toxic reactions. Performed at [facility], [address], [place], [place] [address]   cbc     Status: Abnormal   Collection Time: 11/01/18  3:25 PM  Result Value Ref Range   WBC 7.6 4.0 - 10.5 K/uL   RBC 4.50 4.22 - 5.81 MIL/uL   Hemoglobin 14.4 13.0 - 17.0 g/dL   HCT 91.4 78.2 - 95.6 %   MCV 95.8 80.0 - 100.0 fL   MCH 32.0 26.0 - 34.0 pg   MCHC 33.4 30.0 - 36.0 g/dL   RDW 21.3 08.6 - 57.8 %   Platelets 540 (H) 150 - 400 K/uL   nRBC 0.0 0.0 - 0.2 %    Comment: Performed at [facility], [address], [place], [place] [address]    Medications:  Current Facility-Administered Medications   Medication Dose Route Frequency Provider Last Rate Last Dose  . acetaminophen (TYLENOL) tablet 650 mg  650 mg Oral Q4H PRN Arthor Captain, PA-C   650 mg at 11/02/18 0223  . alum & mag hydroxide-simeth (MAALOX/MYLANTA) 200-200-20 MG/5ML suspension 30 mL  30 mL Oral Q6H PRN Harris, Abigail, PA-C      . amoxicillin-clavulanate (AUGMENTIN) 875-125 MG per tablet 1 tablet  1 tablet Oral Q12H Harris, Abigail, PA-C   1 tablet at 11/02/18 0847  . clotrimazole (LOTRIMIN) 1 % cream   Topical BID Harris, Abigail, PA-C      . docusate sodium (COLACE) capsule 100 mg  100 mg Oral BID Harris, Abigail, PA-C   100 mg at 11/02/18 1102  . loratadine (CLARITIN) tablet 10 mg  10 mg Oral Daily Harris, Abigail, PA-C   10 mg at 11/02/18 1102  . LORazepam (ATIVAN) tablet 1 mg  1 mg Oral Q8H PRN Arthor Captain, PA-C   1 mg at 11/02/18 0215  . metoprolol tartrate (LOPRESSOR) tablet 25 mg  25 mg Oral BID Harris, Abigail, PA-C   25 mg at 11/02/18 1102  . ondansetron (ZOFRAN) tablet 8 mg  8 mg Oral Q4H PRN Harris, Abigail, PA-C      . primidone (MYSOLINE) tablet 250 mg  250 mg Oral TID Arthor Captain, PA-C   250 mg at 11/02/18 1102  . simvastatin (ZOCOR) tablet 20 mg  20 mg Oral Daily Arthor Captain, PA-C   20 mg at 11/02/18 1102   Current Outpatient Medications  Medication Sig Dispense Refill  . acetaminophen (TYLENOL) 325 MG tablet Take 650 mg by mouth every 6 (six) hours as needed for mild pain or headache.    . allopurinol (ZYLOPRIM) 100 MG tablet Take 100  mg by mouth daily.    Marland Kitchen amoxicillin-clavulanate (AUGMENTIN) 875-125 MG tablet Take 1 tablet by mouth every 12 (twelve) hours. 14 tablet 0  . cetirizine (ZYRTEC) 10 MG tablet Take 10 mg by mouth daily.    . clotrimazole (LOTRIMIN) 1 % cream Apply to affected area (glans penis/foreskin) 2 times daily (Patient taking differently: Apply 1 application topically 2 (two) times daily. Apply to affected area (glans penis/foreskin) 2 times daily) 15 g 0  .  clotrimazole-betamethasone (LOTRISONE) cream Apply 1 application topically 2 (two) times daily. Apply to face    . colchicine 0.6 MG tablet Take 0.6 mg by mouth 2 (two) times daily.    Mariane Baumgarten Sodium (STOOL SOFTENER) 100 MG capsule Take 100 mg by mouth 2 (two) times daily.    Marland Kitchen donepezil (ARICEPT) 5 MG tablet Take 5 mg by mouth daily.    . ergocalciferol (VITAMIN D2) 1.25 MG (50000 UT) capsule Take 50,000 Units by mouth every Thursday.     Marland Kitchen ibuprofen (ADVIL) 600 MG tablet Take 1 tablet (600 mg total) by mouth every 6 (six) hours as needed. (Patient taking differently: Take 600 mg by mouth every 6 (six) hours as needed for headache (pain). ) 30 tablet 0  . lisinopril (ZESTRIL) 10 MG tablet Take 10 mg by mouth daily.    Marland Kitchen LORazepam (ATIVAN) 1 MG tablet Take 1 tablet (1 mg total) by mouth every 8 (eight) hours as needed (agitation). 15 tablet 0  . metoprolol tartrate (LOPRESSOR) 25 MG tablet Take 25 mg by mouth 2 (two) times daily.     Marland Kitchen oxyCODONE-acetaminophen (PERCOCET/ROXICET) 5-325 MG tablet Take 1-2 tablets by mouth every 6 (six) hours as needed for severe pain. 5 tablet 0  . primidone (MYSOLINE) 250 MG tablet Take 250 mg by mouth 3 (three) times daily.     . simvastatin (ZOCOR) 20 MG tablet Take 20 mg by mouth daily.    . ondansetron (ZOFRAN) 4 MG tablet Take 2 tablets (8 mg total) by mouth every 4 (four) hours as needed for nausea or vomiting. (Patient not taking: Reported on 11/01/2018) 20 tablet 0    Musculoskeletal: Strength & Muscle Tone: within normal limits Gait & Station: rest in bed during this assessment  Patient leans: N/A  Psychiatric Specialty Exam: Physical Exam  Vitals reviewed. Constitutional: He appears well-developed.  Psychiatric: He has a normal mood and affect. His behavior is normal.    Review of Systems  Psychiatric/Behavioral: Negative for depression.  All other systems reviewed and are negative.   Blood pressure (!) 145/76, pulse 85, temperature 98.4 F  (36.9 C), temperature source Oral, resp. rate 18, SpO2 96 %.There is no height or weight on file to calculate BMI.  General Appearance: Casual  Eye Contact:  Fair  Speech:  Clear and Coherent  Volume:  Normal  Mood:  Depressed  Affect:  Appropriate and Congruent  Thought Process:  Linear  Orientation:  Other:  person and place  Thought Content:  Logical  Suicidal Thoughts:  No  Homicidal Thoughts:  No  Memory:  Immediate;   Fair Recent;   Fair Remote;   Fair  Judgement:  Impaired  Insight:  Lacking  Psychomotor Activity:  NA  Concentration:  Concentration: Fair  Recall:  [employer] of Knowledge:  Fair  Language:  Fair  Akathisia:  NA  Handed:  Right  AIMS (if indicated):     Assets:  Communication Skills Desire for Improvement Resilience Social Support  ADL's:  Intact  Cognition:  Impaired,  Mild  Sleep:        Treatment Plan Summary: Daily contact with patient to assess and evaluate symptoms and progress in treatment and Medication management  NP spoke to RN, who reported she made MD Peerucelli of discharge disposition   Disposition: No evidence of imminent risk to self or others at present.   Patient does not meet criteria for psychiatric inpatient admission. Supportive therapy provided about ongoing stressors. Refer to IOP. Discussed crisis plan, support from social network, calling 911, coming to the Emergency Department, and calling Suicide Hotline.  This service was provided via telemedicine using a 2-way, interactive audio and video technology.  Names of all persons participating in this telemedicine service and their role in this encounter. Name: Raynelle HighlandJerry Mayr Role: patient   Name: T.Lewis  Role: NP           Oneta Rackanika N Lewis, NP 11/02/2018 12:05 PM
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
## 2018-11-02 NOTE — ED Provider Notes (Signed)
Daily rounding on psychiatric patient.  Please see prior provider note for full H&P.  Briefly patient presented to the ED via [facility] from group home after episode of being combative. IVC per group home.   I have reviewed patients labs & vitals. LFTs & platelets slightly elevated from prior, otherwise unremarkable. Tachycardia normalized.   Per [facility] note- case discussed w/ Marvia Pickles NP w/ psych- recommendation for admission to inpatient tx program.   On assessment patient is sleeping, resting comfortably.  Pending placement.    Amaryllis Dyke, PA-C 11/02/18 0926    Deno Etienne, DO 11/02/18 1003

## 2018-11-02 NOTE — ED Notes (Signed)
Diet was ordered for Lunch. 

## 2018-11-02 NOTE — Progress Notes (Signed)
CSW received call from [facility] [place] regarding patient refusing to get out of his bed after being notified that he was psych cleared and ready for discharge. RN reports patient was holding on to the bed and not responding verbally to any questions (he is limited with verbal at baseline). RN reports patient lives as a group home and expressed concern that there may be issues at the group home or something else going on. RN reports she spoke with the group home owner who states patient has been refusing to get up out of his bed. Per RN, the group home is willing to take patient back.  CSW reached out to patient's sister who reports patient's behavior has changed since he had gallbladder surgery 10/19/2018. She reports patient was not given any pain meds after the surgery and struggled with this. She reports patient lays in the bed and intentionally urinates and defecates on himself. She reports patient also had this same behavior when he went to visit their brother. She reports she believes patient is safe to go back to the group home and is well taken care of there and has been a resident there 30 years. She reports patient does have a history of shutting down in this manner.  CSW asked to RN call patient's sister so that she is able to talk to patient and possibly get him to open up. CSW waiting on the outcome of this.  [place], LCSW Transitions of Care Department [facility] ED [phone]

## 2018-11-02 NOTE — ED Notes (Signed)
Pt is gripping on to side of bed not responding to this RN or allowing [place] to clean him. Will reattempt later

## 2018-11-02 NOTE — ED Notes (Signed)
Pt was showered and dirty linens changed

## 2018-11-02 NOTE — ED Notes (Signed)
Pt still refusing to interact with staff to be cleaned

## 2018-11-02 NOTE — ED Notes (Signed)
Patient denies pain and is resting comfortably.  

## 2018-11-02 NOTE — ED Notes (Signed)
Patient refused to let me get his V/S. The Nurse was informed.

## 2018-11-02 NOTE — ED Notes (Signed)
Patient was given a snack and drink. 

## 2018-11-02 NOTE — Progress Notes (Signed)
CSW called patient's group home and spoke to owner Gary Neal, who is unhappy about the assessment but agrees to accept patient back, as he has lived in that group home for 30 years.  Ms. Gary Neal first response was, "I'm going to send him back, I'm going to send him back." This writer cautioned group home owner about the possibility of abusing the ED system and that sending the patient back because he's angry, upset and possibly in pain is not the solution.  Ms. Gary Neal expressed understanding.  Ms. Gary Neal is unable to come to pick patient up as she has 5 more residents in the home.  CSW agreed to contact the ED to see if [facility] would take him home, or if he could be given a taxi voucher.  ED RN, [place] notified.  [name]. Judi Cong, MSW, [place] [facility] Clinical Social Work [phone] (cell) [phone] (office)

## 2018-11-03 DIAGNOSIS — F331 Major depressive disorder, recurrent, moderate: Secondary | ICD-10-CM | POA: Diagnosis not present

## 2018-11-03 NOTE — ED Notes (Signed)
Pt continues to refuse his po meds.

## 2018-11-03 NOTE — ED Notes (Signed)
Pt sister called & said that his nephew, Claris Gower will most likely be the one to pick him up & bring him to his sister "Sharon's" house.

## 2018-11-03 NOTE — ED Notes (Signed)
Diet has been ordered for Lunch. 

## 2018-11-03 NOTE — ED Notes (Signed)
Breakfast tray ordered 

## 2018-11-03 NOTE — ED Notes (Signed)
Pt has refused again to wake up or open his eyes when approached with his medications, will attempt at another time.

## 2018-11-03 NOTE — ED Notes (Signed)
Pt did finally allow this RN to give him his medication & allow staff to assist him into the shower & change into clean clothes.

## 2019-02-23 NOTE — Progress Notes (Signed)
[facility]    Provider:  Dr Lucia GaskinsAhern Requesting Provider: Darel HongElmore,Kevin PA Primary Care Provider: Anna GenreKevin L. Christell Neal, [place], [facility] [place]  CC: Seizures  HPI:  Gary Neal is a 58 y.o. male here as requested by Care, [facility] for seizure disorder. Here with sister who provides most information.  Past medical history seizures, hyperlipidemia, hypertension and mental disability in a group home for 30 years, last seizure prior to recent episode was 23 years ago.  Patient was admitted with sepsis secondary to acute cholecystitis in early June of this year.  He presented with abdominal pain, diarrhea, incontinence, altered mental status, white count was 22,000 sodium was 122 and potassium was 2.9, multiple metabolic issues in the setting of infection.  Patient was discharged on June 3.  He presented again on June 8 with penile irritation and abdominal pain since leaving the hospital.  Also with burning with urination.  UA without UTI.  CBC no leukocytosis.  CMP mild elevation in LFTs, CT abdomen pelvis postop changes from recent cholecystectomy.  He was discharged on Augmentin twice daily.Looks like he presented again on the 12th with weakness and abdominal pain.  Headache and dizziness.  Patient refused CT scan. 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 Agitated, throwing things.   Not in the group home anymore, living with sister. They heard him fall, he was on the floor, unclear if he had a seizure, no jerking or GTCS a week after surgery had abdominal pain and post-op fluid from cholecystectomy. By the time the EMS got there he was fine, unsure if a seizure, he was still on primidone 3x a day. Now he is on primidone 2x a day because of extreme sedation. He has been stable sine then. No seizures in decades and unclear if this was a seizure.  Sister provides history and he is doin gwell on the primidone twice daily, last seizure was 20+ years ago, started having seizures at the age of 60-16, unclear history of medication but been on the primidone for 20+ years, born with mental disabilities.    Reviewed notes, labs and imaging from outside physicians, which showed:  I [facility] [place], patient was newly seen January 18, 2019 by Caryn BeeKevin L. Christell ConstantMoore, PA-C.  He was seen to establish care in the practice.  Patient accompanied by sister who was primary caregiver.  He had a cholecystectomy in June 2020, still having some right upper quadrant pain and right sided thoracic back pain (see below).  His last seizure was mid June.  He takes primidone 250 mg 3 times daily.  Caregiver stopped lunchtime seizure dose due to drowsiness.  He started at age 58 with "grand mal", has history of being treated in the ED for status epilepticus per sister, he is less drowsy since she stopped the lunchtime dose of primidone.  Also has high cholesterol, poorly controlled hypertension, gout, and [place] Helmut Musterlicia and her Hollice EspyGibson been there for 2 months but prior notes state he may have been in a group home.  I reviewed notes from hospital visit in June of this year, he lives in a group home. Patient was admitted with sepsis secondary to acute cholecystitis in early June of this year.  He presented with abdominal pain, diarrhea, incontinence, altered mental status, white count was 22,000 sodium was 122 and  potassium was 2.9, multiple metabolic issues in the setting of infection.  Patient was discharged on June 3.  He presented again on June 8 with penile irritation and abdominal pain since leaving the hospital.  Also with burning with urination.  UA without UTI.  CBC no leukocytosis.  CMP mild elevation in LFTs, CT abdomen pelvis postop changes from recent cholecystectomy.  He was discharged on Augmentin twice daily.  Looks  like he presented again on the 12th with weakness and abdominal pain.  Headache and dizziness.  Patient refused CT scan. Patient sister was the historian, has done well in the group home since he is resided there the past 30 years, patient does not regularly have episodes of behavior probems but had similar episode with illness 7 years ago with strep throat  which resulted in a visit to [facility] and refused a CT scan.  Patient has not had any follow-up for seizure, this was his first seizure in 23 years, in the setting of abdominal pain.  Patient had cholecystitis, acute gangrenous, surgery October 20, 2018.  He presented with right upper quadrant pain.  Primidone level 9.4  Review of Systems: Patient complains of symptoms per HPI as well as the following symptoms:seizures. Pertinent negatives and positives per HPI. All others negative.   Social History   Socioeconomic History  . Marital status: Single    Spouse name: Not on file  . Number of children: 0  . Years of education: Not on file  . Highest education level: High school graduate  Occupational History  . Not on file  Social Needs  . Financial resource strain: Not on file  . Food insecurity    Worry: Not on file    Inability: Not on file  . Transportation needs    Medical: Not on file    Non-medical: Not on file  Tobacco Use  . Smoking status: Never Smoker  . Smokeless tobacco: Never Used  Substance and Sexual Activity  . Alcohol use: Never    Frequency: Never  . Drug use: Never  . Sexual activity: Not on file  Lifestyle  . Physical activity    Days per week: Not on file    Minutes per session: Not on file  . Stress: Not on file  Relationships  . Social Herbalist on phone: Not on file    Gets together: Not on file    Attends religious service: Not on file    Active member of club or organization: Not on file    Attends meetings of clubs or organizations: Not on file    Relationship status: Not on file  .  Intimate partner violence    Fear of current or ex partner: Not on file    Emotionally abused: Not on file    Physically abused: Not on file    Forced sexual activity: Not on file  Other Topics Concern  . Not on file  Social History Narrative   Lives at home with his sister   Caffeine: 1 cup of coffee/day    History reviewed. No pertinent family history.  Past Medical History:  Diagnosis Date  . High cholesterol   . Hypertension   . Mental disorder   . Seizures [facility])     Patient Active Problem List   Diagnosis Date Noted  . Seizure disorder ([place]) 02/24/2019  . Sepsis ([place]) secondary to acute cholecystitis 10/20/2018  . Pleural effusion on right 10/20/2018  . Hyponatremia 10/20/2018  .  AKI (acute kidney injury) ([facility]) 10/20/2018  . Hyperlipidemia 10/20/2018  . Essential hypertension 10/20/2018  . Mental disability 10/20/2018  . Acute cholecystitis 10/19/2018    Past Surgical History:  Procedure Laterality Date  . CHOLECYSTECTOMY N/A 10/20/2018   Procedure: LAPAROSCOPIC CHOLECYSTECTOMY;  Surgeon: Axel Filler, MD;  Location: [facility] ORS;  Service: General;  Laterality: N/A;    Current Outpatient Medications  Medication Sig Dispense Refill  . acetaminophen (TYLENOL) 325 MG tablet Take 650 mg by mouth every 6 (six) hours as needed for mild pain or headache.    . allopurinol (ZYLOPRIM) 100 MG tablet Take 100 mg by mouth daily.    . cetirizine (ZYRTEC) 10 MG tablet Take 10 mg by mouth daily.    . clotrimazole-betamethasone (LOTRISONE) cream Apply 1 application topically 2 (two) times daily. Apply to face    . colchicine 0.6 MG tablet Take 0.6 mg by mouth 2 (two) times daily. As needed    . donepezil (ARICEPT) 5 MG tablet Take 5 mg by mouth daily.    Marland Kitchen lisinopril (ZESTRIL) 10 MG tablet Take 10 mg by mouth daily.    . metoprolol tartrate (LOPRESSOR) 25 MG tablet Take 25 mg by mouth 2 (two) times daily.     . primidone (MYSOLINE) 250 MG tablet Take 1 tablet (250 mg total) by  mouth 2 (two) times daily. 90 tablet 2  . simvastatin (ZOCOR) 20 MG tablet Take 20 mg by mouth daily.    Marland Kitchen amoxicillin-clavulanate (AUGMENTIN) 875-125 MG tablet Take 1 tablet by mouth every 12 (twelve) hours. (Patient not taking: Reported on 02/24/2019) 14 tablet 0  . clotrimazole (LOTRIMIN) 1 % cream Apply to affected area (glans penis/foreskin) 2 times daily (Patient not taking: Reported on 02/24/2019) 15 g 0  . Docusate Sodium (STOOL SOFTENER) 100 MG capsule Take 100 mg by mouth 2 (two) times daily.    . ergocalciferol (VITAMIN D2) 1.25 MG (50000 UT) capsule Take 50,000 Units by mouth every Thursday.     Marland Kitchen ibuprofen (ADVIL) 600 MG tablet Take 1 tablet (600 mg total) by mouth every 6 (six) hours as needed. (Patient not taking: Reported on 02/24/2019) 30 tablet 0  . LORazepam (ATIVAN) 1 MG tablet Take 1 tablet (1 mg total) by mouth every 8 (eight) hours as needed (agitation). (Patient not taking: Reported on 02/24/2019) 15 tablet 0  . ondansetron (ZOFRAN) 4 MG tablet Take 2 tablets (8 mg total) by mouth every 4 (four) hours as needed for nausea or vomiting. (Patient not taking: Reported on 11/01/2018) 20 tablet 0  . oxyCODONE-acetaminophen (PERCOCET/ROXICET) 5-325 MG tablet Take 1-2 tablets by mouth every 6 (six) hours as needed for severe pain. (Patient not taking: Reported on 02/24/2019) 5 tablet 0   No current facility-administered medications for this visit.     Allergies as of 02/24/2019  . (No Known Allergies)    Vitals: BP 140/78 (BP Location: Right Arm, Patient Position: Sitting)   Pulse 92   Temp (!) 97.1 F (36.2 C) Comment: sister 97.5, both taken at front door  Ht 5\' 5"  (1.651 m)   Wt 164 lb (74.4 kg)   BMI 27.29 kg/m  Last Weight:  Wt Readings from Last 1 Encounters:  02/24/19 164 lb (74.4 kg)   Last Height:   Ht Readings from Last 1 Encounters:  02/24/19 5\' 5"  (1.651 m)     Physical exam: Exam: Gen: NAD, conversant, well nourised, obese, well groomed  CV: RRR, no MRG. No Carotid Bruits. No peripheral edema, warm, nontender Eyes: Conjunctivae clear without exudates or hemorrhage  Neuro: Detailed Neurologic Exam  Speech:    Speech is normal; fluent and spontaneous with normal comprehension.  Cognition:    The patient is oriented to person, place, and time;     recent and remote memory intact;     language fluent;     normal attention, concentration,     fund of knowledge Cranial Nerves:    The pupils are equal, round, and reactive to light. The fundi are normal and spontaneous venous pulsations are present. Visual fields are full to finger confrontation. Extraocular movements are intact. Trigeminal sensation is intact and the muscles of mastication are normal. The face is symmetric. The palate elevates in the midline. Hearing intact. Voice is normal. Shoulder shrug is normal. The tongue has normal motion without fasciculations.   Coordination:    Normal finger to nose and heel to shin. Normal rapid alternating movements.   Gait:    Heel-toe and tandem gait are normal.   Motor Observation:    No asymmetry, no atrophy, and no involuntary movements noted. Tone:    Normal muscle tone.    Posture:    Posture is normal. normal erect    Strength:    Strength is V/V in the upper and lower limbs.      Sensation: intact to LT     Reflex Exam:  DTR's:    Deep tendon reflexes in the upper and lower extremities are normal bilaterally.   Toes:    The toes are downgoing bilaterally.   Clonus:    Clonus is absent.    Assessment/Plan:  58 y.o. male here as requested by [facility] for seizure disorder. Here with sister who provides most information.  Past medical history seizures, hyperlipidemia, hypertension and mental disability in a group home for 30 years, last seizure prior to recent episode was 23 years ago.  Patient had a questionable event several months ago in the setting of multiple hospitalizations, sepsis,  cholecystectomy.  He is doing well at this time we discussed we will continue his current management and they are to contact me if he has any other episodes and possibly we can add Keppra.  They do not want to stop the primidone he has been on it for decades.    Orders Placed This Encounter  Procedures  . Primidone, Serum   Meds ordered this encounter  Medications  . primidone (MYSOLINE) 250 MG tablet    Sig: Take 1 tablet (250 mg total) by mouth 2 (two) times daily.    Dispense:  90 tablet    Refill:  2    Cc:   Elmore,Kevin PA  Naomie Dean, MD  [facility] [address] [place], [place] [address]  Phone [phone] Fax [phone]

## 2019-02-24 ENCOUNTER — Ambulatory Visit (INDEPENDENT_AMBULATORY_CARE_PROVIDER_SITE_OTHER): Payer: Medicare HMO | Admitting: Neurology

## 2019-02-24 ENCOUNTER — Other Ambulatory Visit: Payer: Self-pay

## 2019-02-24 ENCOUNTER — Encounter: Payer: Self-pay | Admitting: Neurology

## 2019-02-24 VITALS — BP 140/78 | HR 92 | Temp 97.1°F | Ht 65.0 in | Wt 164.0 lb

## 2019-02-24 DIAGNOSIS — G40909 Epilepsy, unspecified, not intractable, without status epilepticus: Secondary | ICD-10-CM | POA: Insufficient documentation

## 2019-02-24 DIAGNOSIS — F79 Unspecified intellectual disabilities: Secondary | ICD-10-CM

## 2019-02-24 DIAGNOSIS — Z79899 Other long term (current) drug therapy: Secondary | ICD-10-CM

## 2019-02-24 MED ORDER — PRIMIDONE 250 MG PO TABS: 250.0000 mg | ORAL_TABLET | Freq: Two times a day (BID) | ORAL | 2 refills | Status: AC

## 2019-06-24 ENCOUNTER — Other Ambulatory Visit: Payer: Self-pay | Admitting: Family Medicine

## 2019-06-24 DIAGNOSIS — R1011 Right upper quadrant pain: Secondary | ICD-10-CM

## 2019-07-01 ENCOUNTER — Ambulatory Visit
Admission: RE | Admit: 2019-07-01 | Discharge: 2019-07-01 | Disposition: A | Discharge status: Home / Self Care | Payer: Medicare HMO | Source: Ambulatory Visit | Attending: Family Medicine | Admitting: Family Medicine

## 2019-07-01 ENCOUNTER — Other Ambulatory Visit: Payer: Self-pay

## 2019-07-01 DIAGNOSIS — R1011 Right upper quadrant pain: Secondary | ICD-10-CM

## 2019-07-01 MED ORDER — IOPAMIDOL (ISOVUE-300) INJECTION 61%
100.0000 mL | Freq: Once | INTRAVENOUS | Status: AC | PRN
  Administered 2019-07-01: 09:00:00 100 mL via INTRAVENOUS

## 2019-07-30 ENCOUNTER — Other Ambulatory Visit: Payer: Self-pay | Admitting: Physician Assistant

## 2019-07-30 DIAGNOSIS — R918 Other nonspecific abnormal finding of lung field: Secondary | ICD-10-CM

## 2019-08-13 ENCOUNTER — Ambulatory Visit
Admission: RE | Admit: 2019-08-13 | Discharge: 2019-08-13 | Disposition: A | Discharge status: Home / Self Care | Payer: Medicare HMO | Source: Ambulatory Visit | Attending: Physician Assistant | Admitting: Physician Assistant

## 2019-08-13 ENCOUNTER — Other Ambulatory Visit: Payer: Self-pay

## 2019-08-13 DIAGNOSIS — R918 Other nonspecific abnormal finding of lung field: Secondary | ICD-10-CM

## 2019-09-16 ENCOUNTER — Other Ambulatory Visit: Payer: Self-pay

## 2019-09-16 ENCOUNTER — Institutional Professional Consult (permissible substitution): Payer: Self-pay | Admitting: Emergency Medicine

## 2019-09-16 ENCOUNTER — Ambulatory Visit (INDEPENDENT_AMBULATORY_CARE_PROVIDER_SITE_OTHER): Payer: Medicare HMO | Admitting: Emergency Medicine

## 2019-09-16 ENCOUNTER — Encounter: Payer: Self-pay | Admitting: Emergency Medicine

## 2019-09-16 DIAGNOSIS — J849 Interstitial pulmonary disease, unspecified: Secondary | ICD-10-CM

## 2019-09-16 NOTE — Assessment & Plan Note (Signed)
Right greater than left with a peripheral predominance including in the middle lobe.  Suspect based on history that this is due to Covid pneumonitis.  He never tested positive but was symptomatic and was exposed to family who were positive.  Need to follow him with serial imaging to ensure that there is no evolution of the interstitial changes that have been identified.  If they do not change then this probably reflects stable scar.  If there is any progression then we would need to consider the syndrome of ILD that has been associated with Covid infection.  If that is the case then I will check auto-immune labs and refer him to our ILD clinic.

## 2019-09-16 NOTE — Patient Instructions (Signed)
We will plan to repeat your CT scan of the chest in September 2021. Follow-up with Dr. Delton Coombes in September after the CT scan so that we can compare with your priors.

## 2019-09-16 NOTE — Progress Notes (Signed)
Subjective:    Patient ID: Gary Neal, male    DOB: 1961-04-10, 60 y.o.   MRN: [id number]  HPI 59 year old gentleman, never smoker, history of hypertension, seizures, hypercholesterolemia. He lives in a group home for mental disability. He is referred today for evaluation of an abnormal CT scan of the chest.  He underwent gallbladder surgery in early June 2020. He then probably had COVID 19 in late June (multiple exposures, symptoms - bu t was not tested) Then had a repeat CT scan of his abdomen done 07/01/2019 for persistent right upper quadrant pain.  I have reviewed, the scan shows some very mild subpleural reticulation and groundglass change at both bases.  This prompted a dedicated CT chest 08/13/2019 which I have reviewed.  Again there was mild subpleural groundglass opacity inferiorly in both the middle lobe and the bilateral lung bases, right greater than left.  No significant change compared with the abdominal CT.  There were no suspicious nodules, no lymphadenopathy.   He is here with who helps with the history. They describe      Review of Systems As per HPI  Past Medical History:  Diagnosis Date  . High cholesterol   . Hypertension   . Mental disorder   . Seizures ([place])      No family history on file.   Social History   Socioeconomic History  . Marital status: Single    Spouse name: Not on file  . Number of children: 0  . Years of education: Not on file  . Highest education level: High school graduate  Occupational History  . Not on file  Tobacco Use  . Smoking status: Never Smoker  . Smokeless tobacco: Never Used  Substance and Sexual Activity  . Alcohol use: Never  . Drug use: Never  . Sexual activity: Not on file  Other Topics Concern  . Not on file  Social History Narrative   Lives at home with his sister   Caffeine: 1 cup of coffee/day   Social Determinants of Health   Financial Resource Strain:   . Difficulty of Paying Living Expenses:    Food Insecurity:   . Worried About Charity fundraiser in the Last Year:   . Arboriculturist in the Last Year:   Transportation Needs:   . Film/video editor (Medical):   Marland Kitchen Lack of Transportation (Non-Medical):   Physical Activity:   . Days of Exercise per Week:   . Minutes of Exercise per Session:   Stress:   . Feeling of Stress :   Social Connections:   . Frequency of Communication with Friends and Family:   . Frequency of Social Gatherings with Friends and Family:   . Attends Religious Services:   . Active Member of Clubs or Organizations:   . Attends Archivist Meetings:   Marland Kitchen Marital Status:   Intimate Partner Violence:   . Fear of Current or Ex-Partner:   . Emotionally Abused:   Marland Kitchen Physically Abused:   . Sexually Abused:      Not on File   Outpatient Medications Prior to Visit  Medication Sig Dispense Refill  . acetaminophen (TYLENOL) 325 MG tablet Take 650 mg by mouth every 6 (six) hours as needed for mild pain or headache.    . allopurinol (ZYLOPRIM) 100 MG tablet Take 100 mg by mouth daily.    . cetirizine (ZYRTEC) 10 MG tablet Take 10 mg by mouth daily.    . clotrimazole (  LOTRIMIN) 1 % cream Apply to affected area (glans penis/foreskin) 2 times daily 15 g 0  . clotrimazole-betamethasone (LOTRISONE) cream Apply 1 application topically 2 (two) times daily. Apply to face    . colchicine 0.6 MG tablet Take 0.6 mg by mouth 2 (two) times daily. As needed    . donepezil (ARICEPT) 5 MG tablet Take 5 mg by mouth daily.    Marland Kitchen lisinopril (ZESTRIL) 10 MG tablet Take 10 mg by mouth daily.    . metoprolol tartrate (LOPRESSOR) 25 MG tablet Take 25 mg by mouth 2 (two) times daily.     . primidone (MYSOLINE) 250 MG tablet Take 1 tablet (250 mg total) by mouth 2 (two) times daily. 90 tablet 2  . simvastatin (ZOCOR) 20 MG tablet Take 20 mg by mouth daily.    Marland Kitchen amoxicillin-clavulanate (AUGMENTIN) 875-125 MG tablet Take 1 tablet by mouth every 12 (twelve) hours. (Patient not  taking: Reported on 02/24/2019) 14 tablet 0  . Docusate Sodium (STOOL SOFTENER) 100 MG capsule Take 100 mg by mouth 2 (two) times daily.    . ergocalciferol (VITAMIN D2) 1.25 MG (50000 UT) capsule Take 50,000 Units by mouth every Thursday.     Marland Kitchen ibuprofen (ADVIL) 600 MG tablet Take 1 tablet (600 mg total) by mouth every 6 (six) hours as needed. (Patient not taking: Reported on 02/24/2019) 30 tablet 0  . LORazepam (ATIVAN) 1 MG tablet Take 1 tablet (1 mg total) by mouth every 8 (eight) hours as needed (agitation). (Patient not taking: Reported on 02/24/2019) 15 tablet 0  . ondansetron (ZOFRAN) 4 MG tablet Take 2 tablets (8 mg total) by mouth every 4 (four) hours as needed for nausea or vomiting. (Patient not taking: Reported on 11/01/2018) 20 tablet 0  . oxyCODONE-acetaminophen (PERCOCET/ROXICET) 5-325 MG tablet Take 1-2 tablets by mouth every 6 (six) hours as needed for severe pain. (Patient not taking: Reported on 02/24/2019) 5 tablet 0   No facility-administered medications prior to visit.         Objective:   Physical Exam Vitals:   09/16/19 1514  BP: 130/70  Pulse: 99  Temp: 98.1 F (36.7 C)  SpO2: 95%   Gen: Pleasant, well-nourished, in no distress,  normal affect  ENT: No lesions,  mouth clear,  oropharynx clear, no postnasal drip  Neck: No JVD, no stridor  Lungs: No use of accessory muscles, no crackles or wheezing on normal respiration, no wheeze on forced expiration  Cardiovascular: RRR, heart sounds normal, no murmur or gallops, no peripheral edema  Musculoskeletal: No deformities, no cyanosis or clubbing  Neuro: alert, awake, non focal  Skin: Warm, no lesions or rash     Assessment & Plan:  ILD (interstitial lung disease) (HCC) Right greater than left with a peripheral predominance including in the middle lobe.  Suspect based on history that this is due to Covid pneumonitis.  He never tested positive but was symptomatic and was exposed to family who were positive.   Need to follow him with serial imaging to ensure that there is no evolution of the interstitial changes that have been identified.  If they do not change then this probably reflects stable scar.  If there is any progression then we would need to consider the syndrome of ILD that has been associated with Covid infection.  If that is the case then I will check auto-immune labs and refer him to our ILD clinic.  Levy Pupa, MD, PhD 09/16/2019, 3:49 PM [facility]  [phone] or if no answer [phone]

## 2019-09-16 NOTE — Addendum Note (Signed)
Addended by: Delrae Rend on: 09/16/2019 04:38 PM   Modules accepted: Orders

## 2020-02-03 ENCOUNTER — Ambulatory Visit
Admission: RE | Admit: 2020-02-03 | Discharge: 2020-02-03 | Disposition: A | Discharge status: Home / Self Care | Payer: Medicare HMO | Source: Ambulatory Visit | Attending: Emergency Medicine | Admitting: Emergency Medicine

## 2020-02-03 ENCOUNTER — Other Ambulatory Visit: Payer: Medicare HMO

## 2020-02-03 ENCOUNTER — Other Ambulatory Visit: Payer: Self-pay

## 2020-02-03 DIAGNOSIS — J849 Interstitial pulmonary disease, unspecified: Secondary | ICD-10-CM

## 2020-06-08 IMAGING — CT CT CHEST W/O CM
1 series · 15 of 34 positions shown, 19 images · non-contrast
Comparison: Partial comparison to CT abdomen/pelvis dated
07/01/2019

CLINICAL DATA: Abnormal chest radiograph, difficulty breathing

EXAM:
CT CHEST WITHOUT CONTRAST
TECHNIQUE: Multidetector CT imaging of the chest was performed following the
standard protocol without IV contrast.

[Series 2: chest w/(date) · axial · 0.73mm/px · z∈[-309,-35]mm · 15 of 161 slices shown, 19 images]
[im 12/161  mediastinal]
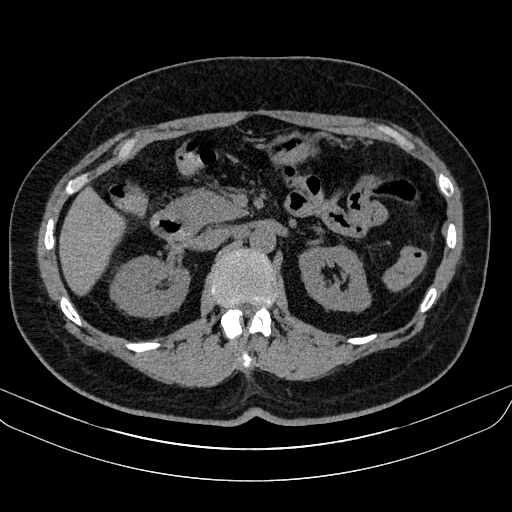
[im 12/161  lung]
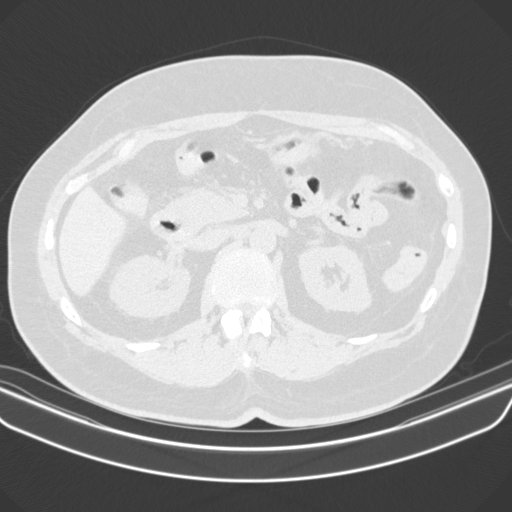
[im 24/161  lung]
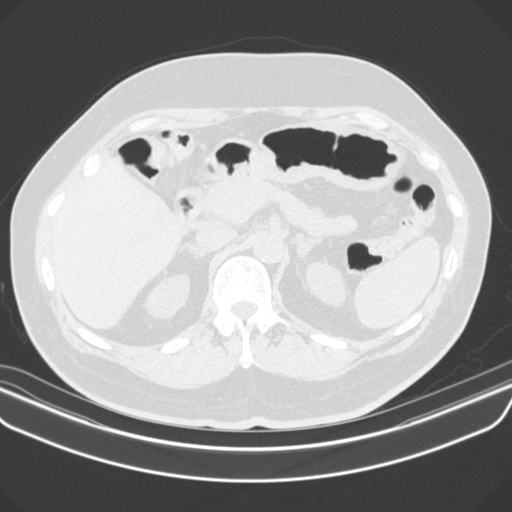
[im 33/161  lung]
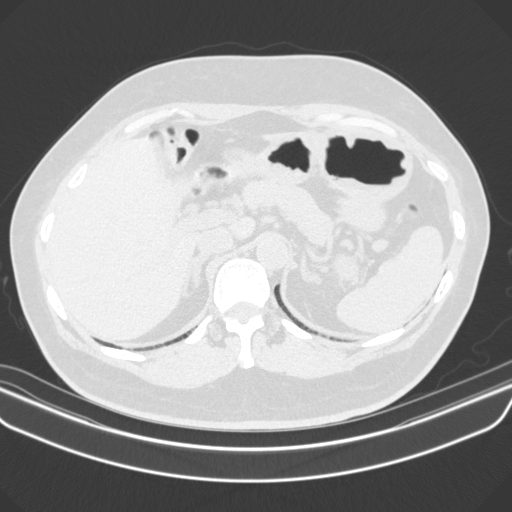
[im 42/161  lung]
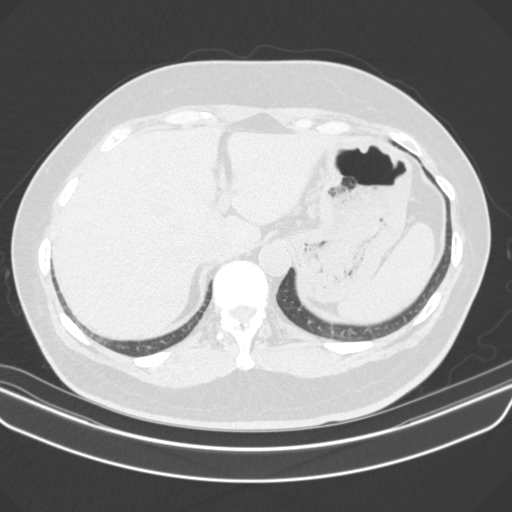
[im 54/161  mediastinal]
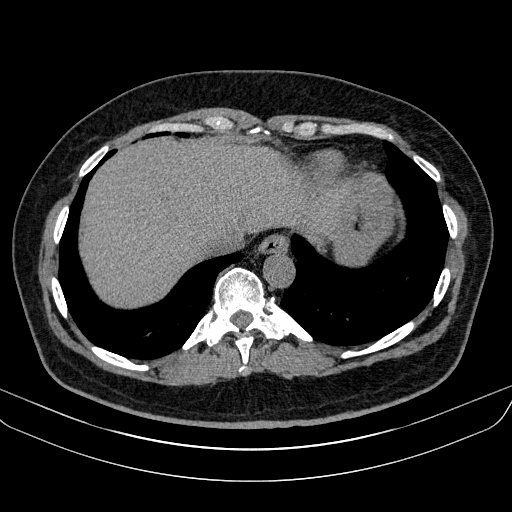
[im 54/161  lung]
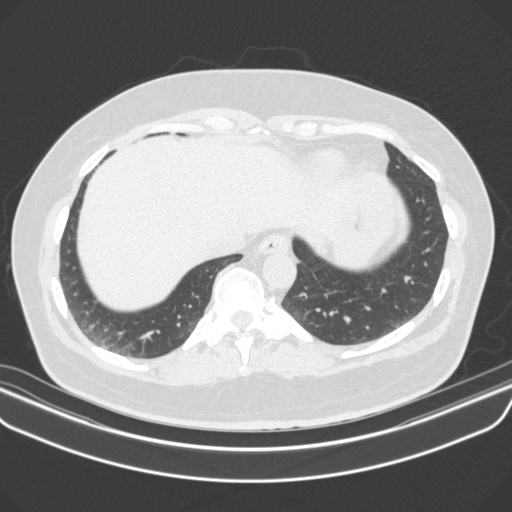
[im 65/161  lung]
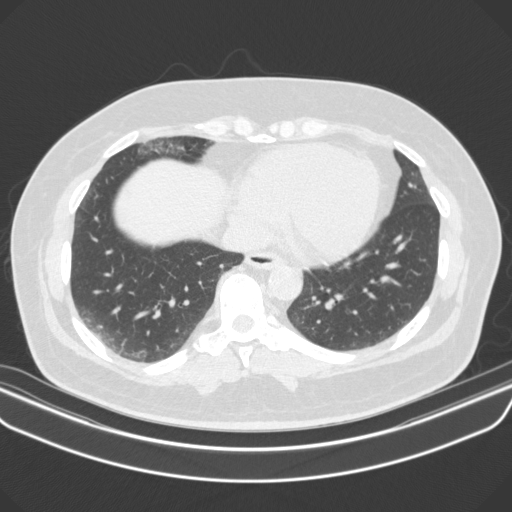
[im 72/161  lung]
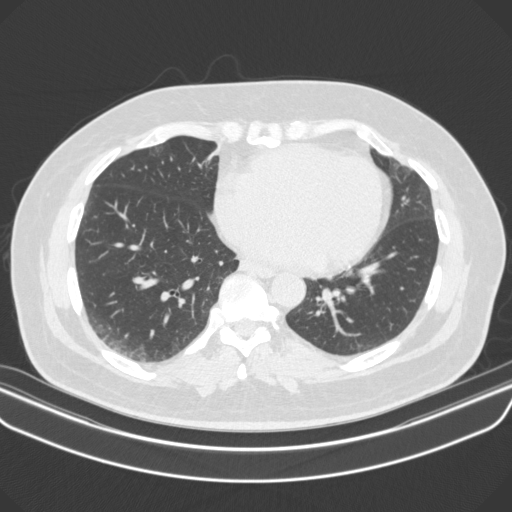
[im 83/161  lung]
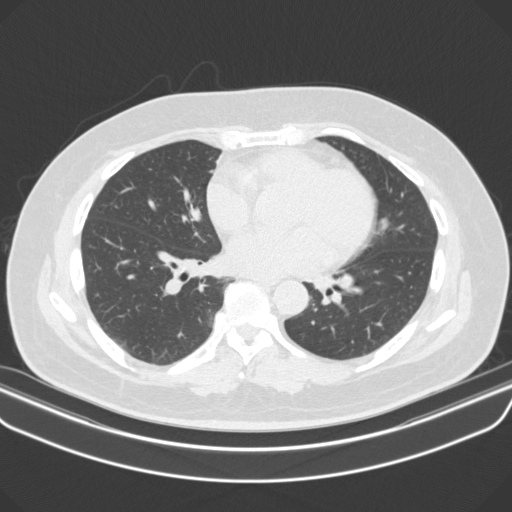
[im 89/161  mediastinal]
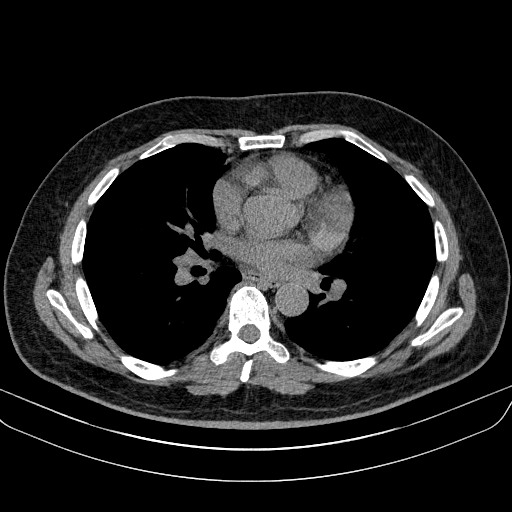
[im 89/161  lung]
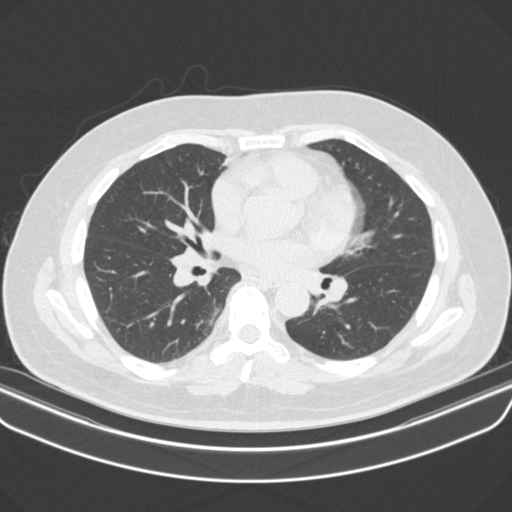
[im 97/161  lung]
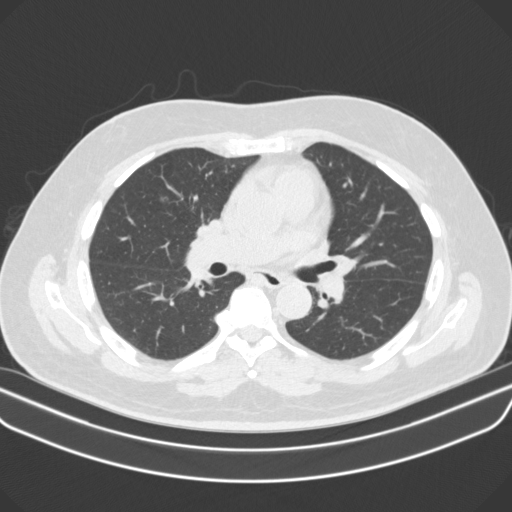
[im 107/161  lung]
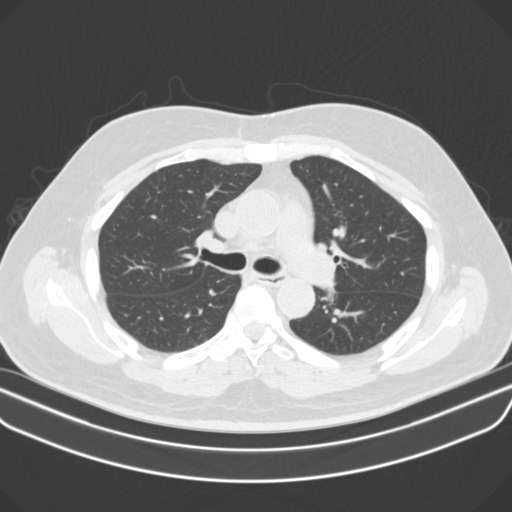
[im 119/161  lung]
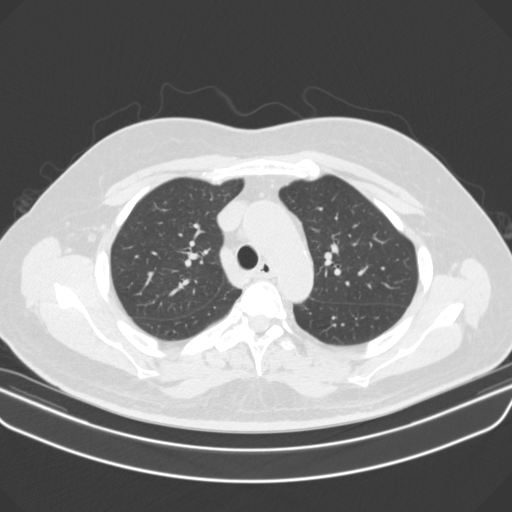
[im 129/161  mediastinal]
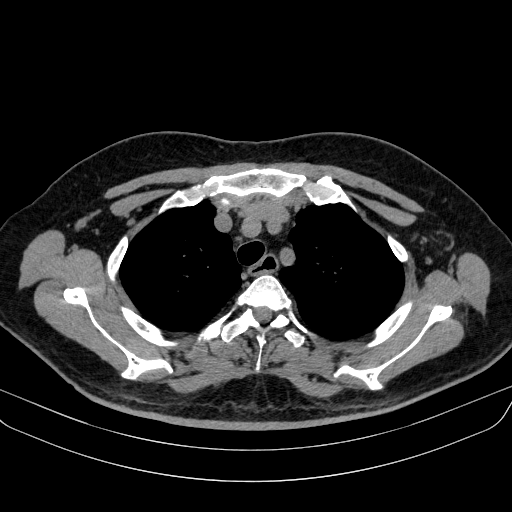
[im 129/161  lung]
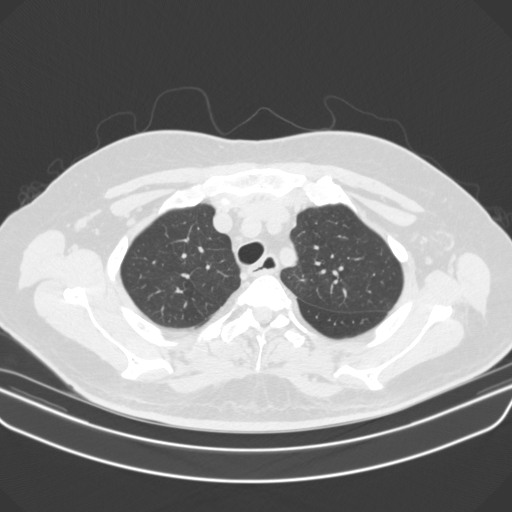
[im 137/161  lung]
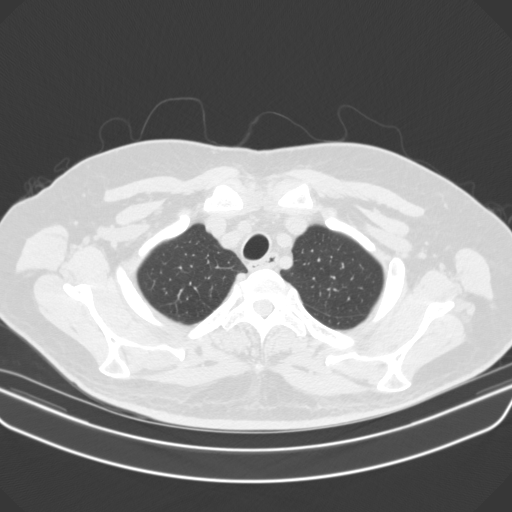
[im 149/161  lung]
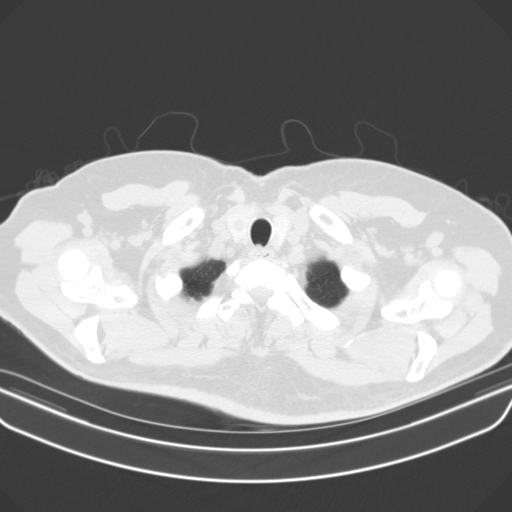

[15 of 34 positions shown; findings below may reference images not displayed]

FINDINGS: Cardiovascular: Heart is normal in size. No pericardial effusion.

No evidence thoracic aortic aneurysm. Mild atherosclerotic
calcifications of the aortic arch.

Mediastinum/Nodes: No suspicious mediastinal lymphadenopathy.

8 mm right thyroid nodule. No followup recommended (ref: [HOSPITAL]. [DATE]): 143-50).

Lungs/Pleura: Mild subpleural ground-glass opacity post reticulation
inferiorly in the right middle lobe (series 3/image 97) and in the
bilateral lung bases, right greater than left (series 3/image 98).
This appearance is unchanged from recent prior CT abdomen/pelvis.

No focal consolidation.

No suspicious pulmonary nodules.

No pleural effusion or pneumothorax.

Upper Abdomen: Visualized upper abdomen is notable for prior
cholecystectomy and mild vascular calcifications.

Musculoskeletal: Visualized osseous structures are within normal
limits.
IMPRESSION: Mild subpleural ground-glass opacities/reticulation at the lung
bases, possibly reflecting very mild chronic interstitial lung
disease, unchanged from recent prior CT abdomen/pelvis. Outpatient
pulmonary consultation is suggested.

No evidence of acute cardiopulmonary disease.

Aortic Atherosclerosis (V150G-MYY.Y).

## 2020-10-19 ENCOUNTER — Ambulatory Visit (INDEPENDENT_AMBULATORY_CARE_PROVIDER_SITE_OTHER): Payer: Medicare HMO | Admitting: Neurology

## 2020-10-19 ENCOUNTER — Encounter: Payer: Self-pay | Admitting: *Deleted

## 2020-10-19 ENCOUNTER — Encounter: Payer: Self-pay | Admitting: Neurology

## 2020-10-19 VITALS — BP 143/73 | HR 79 | Ht 65.5 in | Wt 169.0 lb

## 2020-10-19 DIAGNOSIS — F79 Unspecified intellectual disabilities: Secondary | ICD-10-CM

## 2020-10-19 DIAGNOSIS — G40909 Epilepsy, unspecified, not intractable, without status epilepticus: Secondary | ICD-10-CM

## 2020-10-19 NOTE — Progress Notes (Signed)
[facility]    Provider:  Dr Lucia GaskinsAhern Requesting Provider: Darel HongElmore,Kevin Neal Primary Care Provider: Anna GenreKevin L. Christell Neal, Neal, [facility] [place]  CC: Seizures  10/19/2020: He is here for followup. He is here with his sister. His thinking process is Neal little "off". No seizures. He has been on these medications for years. No sedation or confusion. No seizures. Happy. Doing well. No napping during the day. Happy, eating well, no falls, sleeping well, he has occ headaches, every once in Neal while, we discussed sleep apnea but he is not tired during the day, not excessive fatigue during the day. No recent illnesses, he had covid in 2020. Still living with his sister. He can get an attitude, she can redirect and he is fine.   HPI 02/24/2019:  Gary Neal is Neal 60 y.o. male here as requested by Gary PeeElmore, Gary Neal, Neal for seizure disorder. Here with sister who provides most information.  Past medical history seizures, hyperlipidemia, hypertension and mental disability in [facility] group home for 30 years, last seizure prior to recent episode was 23 years ago.  Patient was admitted with sepsis secondary to acute cholecystitis in early June of this year.  He presented with abdominal pain, diarrhea, incontinence, altered mental status, white count was 22,000 sodium was 122 and potassium was 2.9, multiple metabolic issues in the setting of infection.  Patient was discharged on June 3.  He presented again on June 8 with penile irritation and abdominal pain since leaving the hospital.  Also with burning with urination.  UA without UTI.  CBC no leukocytosis.  CMP mild elevation in LFTs, CT abdomen pelvis postop changes from recent cholecystectomy.  He was discharged on Augmentin twice daily.Looks like he presented again on the 12th with weakness and abdominal pain.  Headache and dizziness.  Patient refused CT scan. He was seen by psychiatry and stayed in the ED for several days under involuntary  commitment for behavioral issues likely due to intellectual disability and pain. Agitated, throwing things.   Not in the group home anymore, living with sister. They heard him fall, he was on the floor, unclear if he had Neal seizure, no jerking or GTCS Neal week after surgery had abdominal pain and post-op fluid from cholecystectomy. By the time the EMS got there he was fine, unsure if Neal seizure, he was still on primidone 3x Neal day. Now he is on primidone 2x Neal day because of extreme sedation. He has been stable sine then. No seizures in decades and unclear if this was Neal seizure. Sister provides history and he is doin gwell on the primidone twice daily, last seizure was 20+ years ago, started having seizures at the age of 60-16, unclear history of medication but been on the primidone for 20+ years, born with mental disabilities.    Reviewed notes, labs and imaging from outside physicians, which showed:  [facility] [place], patient was newly seen January 18, 2019 by Gary Neal.  He was seen to establish care in the practice.  Patient accompanied by sister who was primary caregiver.  He had Neal cholecystectomy in June 2020, still having some right upper quadrant pain and right sided thoracic back pain (see below).  His last seizure was mid June.  He takes primidone 250 mg 3 times daily.  Caregiver stopped lunchtime seizure dose due to drowsiness.  He started at age 60 with "grand mal", has history of being treated in  the ED for status epilepticus per sister, he is less drowsy since she stopped the lunchtime dose of primidone.  Also has high cholesterol, poorly controlled hypertension, gout, and lives in [place] with Gary Neal and her Gary Neal been there for 2 months but prior notes state he may have been in Neal group home.  I reviewed notes from hospital visit in June of this year, he lives in Neal group home. Patient was admitted with sepsis secondary to acute  cholecystitis in early June of this year.  He presented with abdominal pain, diarrhea, incontinence, altered mental status, white count was 22,000 sodium was 122 and potassium was 2.9, multiple metabolic issues in the setting of infection.  Patient was discharged on June 3.  He presented again on June 8 with penile irritation and abdominal pain since leaving the hospital.  Also with burning with urination.  UA without UTI.  CBC no leukocytosis.  CMP mild elevation in LFTs, CT abdomen pelvis postop changes from recent cholecystectomy.  He was discharged on Augmentin twice daily.  Looks like he presented again on the 12th with weakness and abdominal pain.  Headache and dizziness.  Patient refused CT scan. Patient sister was the historian, has done well in the group home since he is resided there the past 30 years, patient does not regularly have episodes of behavior probems but had similar episode with illness 7 years ago with strep throat  which resulted in Neal visit to Gary Neal long and refused Neal CT scan.  Patient has not had any follow-up for seizure, this was his first seizure in 23 years, in the setting of abdominal pain.  Patient had cholecystitis, acute gangrenous, surgery October 20, 2018.  He presented with right upper quadrant pain.  Primidone level 9.4  Review of Systems: Patient complains of symptoms per HPI as well as the following symptoms:seizures. Pertinent negatives and positives per HPI. All others negative.   Social History   Socioeconomic History  . Marital status: Single    Spouse name: Not on file  . Number of children: 0  . Years of education: Not on file  . Highest education level: High school graduate  Occupational History  . Not on file  Tobacco Use  . Smoking status: Never Smoker  . Smokeless tobacco: Never Used  Vaping Use  . Vaping Use: Never used  Substance and Sexual Activity  . Alcohol use: Never  . Drug use: Never  . Sexual activity: Not on file  Other Topics Concern   . Not on file  Social History Narrative   Lives at home with his sister   Caffeine: 1 cup of coffee/day   Social Determinants of Health   Financial Resource Strain: Not on file  Food Insecurity: Not on file  Transportation Needs: Not on file  Physical Activity: Not on file  Stress: Not on file  Social Connections: Not on file  Intimate Partner Violence: Not on file    Family History  Problem Relation Age of Onset  . Cancer Mother   . Diabetes Mother   . Rheum arthritis Mother   . Migraines Mother   . Alzheimer's disease Mother   . Stroke Mother     Past Medical History:  Diagnosis Date  . Acute cholecystitis 10/19/2018  . AKI (acute kidney injury) (HCC) 10/20/2018  . Cataract   . Colon polyps 09/22/2009  . High cholesterol   . Hypertension   . Hyponatremia 10/20/2018  . Mental disorder   . Seborrhea 05/25/2013  .  Seizures (HCC)   . Sepsis (HCC) 10/20/2018  . Shingles     Patient Active Problem List   Diagnosis Date Noted  . ILD (interstitial lung disease) (HCC) 09/16/2019  . Seizure disorder (HCC) 02/24/2019  . Sepsis (HCC) secondary to acute cholecystitis 10/20/2018  . Pleural effusion on right 10/20/2018  . Hyponatremia 10/20/2018  . AKI (acute kidney injury) (HCC) 10/20/2018  . Hyperlipidemia 10/20/2018  . Essential hypertension 10/20/2018  . Mental disability 10/20/2018  . Acute cholecystitis 10/19/2018    Past Surgical History:  Procedure Laterality Date  . CHOLECYSTECTOMY N/Neal 10/20/2018   Procedure: LAPAROSCOPIC CHOLECYSTECTOMY;  Surgeon: Axel Filler, MD;  Location: [facility];  Service: General;  Laterality: N/Neal;    Current Outpatient Medications  Medication Sig Dispense Refill  . acetaminophen (TYLENOL) 325 MG tablet Take 650 mg by mouth every 6 (six) hours as needed for mild pain or headache.    . allopurinol (ZYLOPRIM) 100 MG tablet Take 100 mg by mouth daily.    Marland Kitchen amLODipine (NORVASC) 10 MG tablet Take 10 mg by mouth daily.    .  cetirizine (ZYRTEC) 10 MG tablet Take 10 mg by mouth daily.    . cholestyramine (QUESTRAN) 4 GM/DOSE powder Take 4 g by mouth 2 (two) times daily with Neal meal.    . clotrimazole (LOTRIMIN) 1 % cream Apply to affected area (glans penis/foreskin) 2 times daily 15 g 0  . clotrimazole-betamethasone (LOTRISONE) cream Apply 1 application topically 2 (two) times daily. Apply to face    . colchicine 0.6 MG tablet Take 0.6 mg by mouth 2 (two) times daily. As needed    . donepezil (ARICEPT) 5 MG tablet Take 5 mg by mouth daily.    . fexofenadine (ALLEGRA) 180 MG tablet Take 180 mg by mouth daily.    Marland Kitchen lisinopril-hydrochlorothiazide (ZESTORETIC) 20-12.5 MG tablet Take 2 tablets by mouth daily.    . metoprolol tartrate (LOPRESSOR) 25 MG tablet Take 25 mg by mouth 2 (two) times daily.     Marland Kitchen omeprazole (PRILOSEC) 20 MG capsule Take 20 mg by mouth daily.    . primidone (MYSOLINE) 250 MG tablet Take 1 tablet (250 mg total) by mouth 2 (two) times daily. 90 tablet 2  . simvastatin (ZOCOR) 20 MG tablet Take 20 mg by mouth daily.     No current facility-administered medications for this visit.    Allergies as of 10/19/2020  . (Not on File)    Vitals: BP (!) 143/73 (BP Location: Right Arm, Patient Position: Sitting)   Pulse 79   Ht 5' 5.5" (1.664 m)   Wt 169 lb (76.7 kg)   SpO2 98%   BMI 27.70 kg/m  Last Weight:  Wt Readings from Last 1 Encounters:  10/19/20 169 lb (76.7 kg)   Last Height:   Ht Readings from Last 1 Encounters:  10/19/20 5' 5.5" (1.664 m)   Exam: NAD, pleasant                  Speech:    Speech is normal; fluent and spontaneous with normal comprehension.  Cognition:    The patient is oriented to person,     language fluent;    Cranial Nerves:    The pupils are equal, round, and reactive to light.Trigeminal sensation is intact and the muscles of mastication are normal. The face is symmetric. The palate elevates in the midline. Hearing intact. Voice is normal. Shoulder shrug is  normal. The tongue has normal motion without fasciculations.  Coordination:  No dysmetria  Motor Observation:    No asymmetry, no atrophy, and no involuntary movements noted. Tone:    Normal muscle tone.     Strength:    Strength is V/V in the upper and lower limbs.      Sensation: intact to LT    Assessment/Plan:  60 y.o. male here as requested by [facility], [facility] for seizure disorder. Here with sister who provides most information.  Past medical history seizures, hyperlipidemia, hypertension and mental disability in Neal group home for 30 years, last seizure prior to recent episode was 23 years ago.  Patient had Neal questionable event in 2020 in the setting of multiple hospitalizations, sepsis, cholecystectomy.    -Primidone level is slightly elevated however he has been on this for decades, at one time he was on 250 3 times daily and that was reduced to 250 twice daily.  No seizures.  No drowsiness, no ataxia, no side effects of the primidone, neuro exam is nonfocal except for stable chronic intellectual disability.  At this time we will just keep his medications as is, if sister does notice Neal decline in cognition we can think about decreasing it however if patient is stable and doing well I hesitate to change what he has been on for years that  is working.  -We are happy to follow with patient yearly or every 6 months  10/08/20: cmp normal, phenobarb level 36.9, cbc unremarkable  No orders of the defined types were placed in this encounter.  No orders of the defined types were placed in this encounter.   Cc:   Elmore,Kevin Neal  Naomie Dean, MD  [facility] [address] [place], [place] [address]  Phone [phone] Fax [phone]  I spent 15 minutes of face-to-face and non-face-to-face time with patient on the  1. Seizure disorder (HCC)   2. Mental disability    diagnosis.  This included previsit chart review, lab review, study review,  order entry, electronic health record documentation, patient education on the different diagnostic and therapeutic options, counseling and coordination of care, risks and benefits of management, compliance, or risk factor reduction

## 2021-01-15 ENCOUNTER — Other Ambulatory Visit: Payer: Self-pay | Admitting: Family Medicine

## 2021-01-15 DIAGNOSIS — R0781 Pleurodynia: Secondary | ICD-10-CM

## 2021-01-15 DIAGNOSIS — R0789 Other chest pain: Secondary | ICD-10-CM

## 2021-02-03 ENCOUNTER — Other Ambulatory Visit: Payer: Medicare HMO
